# Patient Record
Sex: Female | Born: 1950 | Race: Black or African American | Hispanic: No | Smoking: Never smoker
Health system: Southern US, Community
[De-identification: ages and names within clinical notes are randomized; demographics above are authoritative.]

## PROBLEM LIST (undated history)

## (undated) DIAGNOSIS — M7581 Other shoulder lesions, right shoulder: Secondary | ICD-10-CM

## (undated) DIAGNOSIS — I8390 Asymptomatic varicose veins of unspecified lower extremity: Secondary | ICD-10-CM

## (undated) DIAGNOSIS — E039 Hypothyroidism, unspecified: Secondary | ICD-10-CM

## (undated) DIAGNOSIS — N951 Menopausal and female climacteric states: Secondary | ICD-10-CM

## (undated) DIAGNOSIS — B379 Candidiasis, unspecified: Secondary | ICD-10-CM

## (undated) DIAGNOSIS — J329 Chronic sinusitis, unspecified: Secondary | ICD-10-CM

## (undated) DIAGNOSIS — N898 Other specified noninflammatory disorders of vagina: Secondary | ICD-10-CM

## (undated) DIAGNOSIS — I10 Essential (primary) hypertension: Secondary | ICD-10-CM

## (undated) DIAGNOSIS — Z8742 Personal history of other diseases of the female genital tract: Secondary | ICD-10-CM

## (undated) DIAGNOSIS — I341 Nonrheumatic mitral (valve) prolapse: Secondary | ICD-10-CM

## (undated) DIAGNOSIS — D219 Benign neoplasm of connective and other soft tissue, unspecified: Secondary | ICD-10-CM

## (undated) DIAGNOSIS — M1990 Unspecified osteoarthritis, unspecified site: Secondary | ICD-10-CM

## (undated) HISTORY — DX: Other shoulder lesions, right shoulder: M75.81

## (undated) HISTORY — DX: Essential (primary) hypertension: I10

## (undated) HISTORY — DX: Menopausal and female climacteric states: N95.1

## (undated) HISTORY — DX: Chronic sinusitis, unspecified: J32.9

## (undated) HISTORY — DX: Asymptomatic varicose veins of unspecified lower extremity: I83.90

## (undated) HISTORY — DX: Unspecified osteoarthritis, unspecified site: M19.90

## (undated) HISTORY — DX: Personal history of other diseases of the female genital tract: Z87.42

## (undated) HISTORY — DX: Other specified noninflammatory disorders of vagina: N89.8

## (undated) HISTORY — DX: Benign neoplasm of connective and other soft tissue, unspecified: D21.9

## (undated) HISTORY — PX: ABDOMINAL HYSTERECTOMY: SHX81

## (undated) HISTORY — DX: Candidiasis, unspecified: B37.9

## (undated) HISTORY — DX: Nonrheumatic mitral (valve) prolapse: I34.1

## (undated) HISTORY — DX: Hypothyroidism, unspecified: E03.9

---

## 1994-06-13 HISTORY — PX: TOTAL ABDOMINAL HYSTERECTOMY W/ BILATERAL SALPINGOOPHORECTOMY: SHX83

## 1998-11-18 ENCOUNTER — Ambulatory Visit (HOSPITAL_COMMUNITY): Admission: RE | Admit: 1998-11-18 | Discharge: 1998-11-18 | Payer: Self-pay | Admitting: Gastroenterology

## 1999-06-14 DIAGNOSIS — M778 Other enthesopathies, not elsewhere classified: Secondary | ICD-10-CM

## 1999-06-14 HISTORY — DX: Other enthesopathies, not elsewhere classified: M77.8

## 1999-06-18 ENCOUNTER — Encounter: Admission: RE | Admit: 1999-06-18 | Discharge: 1999-06-18 | Payer: Self-pay | Admitting: Obstetrics and Gynecology

## 1999-06-18 ENCOUNTER — Encounter: Payer: Self-pay | Admitting: Obstetrics and Gynecology

## 2000-06-26 ENCOUNTER — Encounter: Admission: RE | Admit: 2000-06-26 | Discharge: 2000-06-26 | Payer: Self-pay | Admitting: Obstetrics and Gynecology

## 2000-06-26 ENCOUNTER — Encounter: Payer: Self-pay | Admitting: Obstetrics and Gynecology

## 2001-05-31 ENCOUNTER — Other Ambulatory Visit: Admission: RE | Admit: 2001-05-31 | Discharge: 2001-05-31 | Payer: Self-pay | Admitting: Obstetrics and Gynecology

## 2001-06-29 ENCOUNTER — Ambulatory Visit (HOSPITAL_COMMUNITY): Admission: RE | Admit: 2001-06-29 | Discharge: 2001-06-29 | Payer: Self-pay | Admitting: Obstetrics and Gynecology

## 2001-06-29 ENCOUNTER — Encounter: Payer: Self-pay | Admitting: Obstetrics and Gynecology

## 2002-07-02 ENCOUNTER — Other Ambulatory Visit: Admission: RE | Admit: 2002-07-02 | Discharge: 2002-07-02 | Payer: Self-pay | Admitting: Obstetrics and Gynecology

## 2002-07-16 ENCOUNTER — Encounter: Payer: Self-pay | Admitting: Obstetrics and Gynecology

## 2002-07-16 ENCOUNTER — Ambulatory Visit (HOSPITAL_COMMUNITY): Admission: RE | Admit: 2002-07-16 | Discharge: 2002-07-16 | Payer: Self-pay | Admitting: Obstetrics and Gynecology

## 2003-06-27 ENCOUNTER — Encounter: Admission: RE | Admit: 2003-06-27 | Discharge: 2003-06-27 | Payer: Self-pay | Admitting: Internal Medicine

## 2003-07-21 ENCOUNTER — Ambulatory Visit (HOSPITAL_COMMUNITY): Admission: RE | Admit: 2003-07-21 | Discharge: 2003-07-21 | Payer: Self-pay | Admitting: Obstetrics and Gynecology

## 2003-10-03 ENCOUNTER — Inpatient Hospital Stay (HOSPITAL_COMMUNITY): Admission: AD | Admit: 2003-10-03 | Discharge: 2003-10-11 | Payer: Self-pay | Admitting: Internal Medicine

## 2003-10-14 ENCOUNTER — Encounter: Admission: RE | Admit: 2003-10-14 | Discharge: 2003-10-14 | Payer: Self-pay | Admitting: Internal Medicine

## 2004-07-29 ENCOUNTER — Ambulatory Visit (HOSPITAL_COMMUNITY): Admission: RE | Admit: 2004-07-29 | Discharge: 2004-07-29 | Payer: Self-pay | Admitting: Obstetrics and Gynecology

## 2004-09-01 ENCOUNTER — Ambulatory Visit (HOSPITAL_COMMUNITY): Admission: RE | Admit: 2004-09-01 | Discharge: 2004-09-01 | Payer: Self-pay | Admitting: Gastroenterology

## 2005-08-31 ENCOUNTER — Ambulatory Visit (HOSPITAL_COMMUNITY): Admission: RE | Admit: 2005-08-31 | Discharge: 2005-08-31 | Payer: Self-pay | Admitting: Obstetrics and Gynecology

## 2006-09-05 ENCOUNTER — Ambulatory Visit (HOSPITAL_COMMUNITY): Admission: RE | Admit: 2006-09-05 | Discharge: 2006-09-05 | Payer: Self-pay | Admitting: Obstetrics and Gynecology

## 2006-09-06 ENCOUNTER — Ambulatory Visit (HOSPITAL_COMMUNITY): Admission: RE | Admit: 2006-09-06 | Discharge: 2006-09-06 | Payer: Self-pay | Admitting: Obstetrics and Gynecology

## 2007-09-25 ENCOUNTER — Ambulatory Visit (HOSPITAL_COMMUNITY): Admission: RE | Admit: 2007-09-25 | Discharge: 2007-09-25 | Payer: Self-pay | Admitting: Obstetrics and Gynecology

## 2008-11-11 ENCOUNTER — Ambulatory Visit (HOSPITAL_COMMUNITY): Admission: RE | Admit: 2008-11-11 | Discharge: 2008-11-11 | Payer: Self-pay | Admitting: Obstetrics and Gynecology

## 2009-10-26 ENCOUNTER — Encounter: Admission: RE | Admit: 2009-10-26 | Discharge: 2009-10-26 | Payer: Self-pay | Admitting: Internal Medicine

## 2009-12-16 ENCOUNTER — Ambulatory Visit (HOSPITAL_COMMUNITY): Admission: RE | Admit: 2009-12-16 | Discharge: 2009-12-16 | Payer: Self-pay | Admitting: Obstetrics and Gynecology

## 2010-10-29 NOTE — H&P (Signed)
NAME:  Tracie Sutton, Tracie Sutton                            ACCOUNT NO.:  [id number]   MEDICAL RECORD NO.:  [id number]                   PATIENT TYPE:  INP   LOCATION:  5528                                 FACILITY:  [facility]   PHYSICIAN:  Eric L. August Saucer, M.D.                  DATE OF BIRTH:  1951/05/07   DATE OF ADMISSION:  10/03/2003  DATE OF DISCHARGE:                                HISTORY & PHYSICAL   CHIEF COMPLAINT:  Progressive weakness, refractory nausea and vomiting with  diarrhea.   HISTORY OF PRESENT ILLNESS:  First recent [facility]  for this 60-  year-old, married, black female who was doing well until approximately five  days prior to admission.  The patient had recent return from a business  meeting.  She had felt well during that time.  One day later, she noted the  onset of nausea followed by vomiting.  Over the subsequent days, she had  recurring bouts of nausea and vomiting on an every one-to-two-hour basis.  This was subsequently followed by diarrhea.  Stools at that time were mostly  watery.  There was no change in odor or hematemesis, melena or hematochezia.  The patient was unable to keep mostly solid foods down.  She began drinking  ginger ale and Gator Aid.  The patient contacted our office with the above-  noted symptoms.  There was a question of bowel gastroenteritis versus  bacterial gastroenteritis.  She was given Cipro 500 mg b.i.d. for three days  empirically.  She, however, continued to have significant diarrhea.  Her  nausea was checked with Phenergan.  Today the patient felt extremely weak.  This was associated with lightheadedness when standing.  Her symptoms  improved with sitting.  When she was erect, she had more problems with  nausea as well.  The patient was seen in the office for evaluation and was  noted to be markedly hypotensive.  She was subsequently admitted for further  evaluation.  Of note, the patient had previously been on Lasix as well as  triamterene and hydrochlorothiazide for blood pressure control.  She notably  had been continuing to take this medication throughout this period of time.  She denied chest pain or shortness of breath.   PAST MEDICAL HISTORY:  1. Remarkable for longstanding hypertension.  2. She has had problems with obesity.  3. Asthma.  4. Allergic rhinitis.  5. Her history of is significant for eating a great deal of shrimp on a     recent trip.  The patient, however, denies any unusual rash or itching.  6. Past medical history is otherwise remarkable for mitral valve prolapse.  7. She is status post hysterectomy for severe uterine fibroids and     dysmenorrhea.  8. She has had significant arthritis and problems with her knees as well.   ALLERGIES:  Severe SULFA allergies.  HABITS:  The patient does not smoke or drink.   PRESENT MEDICATIONS:  1. Albuterol inhaler q.i.d.  2. Benicar 40 mg daily.  3. Triamterene and hydrochlorothiazide 25 mg daily.  4. Lasix 20 mg daily.  5. Premarin 0.3 mg p.o. daily.  6. Allegra 180 mg p.o. daily p.r.n.  7. Synthroid 100 mcg p.o. daily.  8. Cipro 500 mg b.i.d.  9. Promethazine 25 mg q.3-4h. p.r.n. nausea.   SOCIAL HISTORY:  The patient is married, has no children.  Presently works  at [employer].   PHYSICAL EXAMINATION:  GENERAL:  She is a weak-appearing black female.  Height 5 feet 4 inches, weight 192 pounds.  VITAL SIGNS:  Blood pressure lying 86/57, sitting 82/54.  Heart rate of 96,  respiratory rate 18, temperature 97.7.  HEENT:  Normocephalic, atraumatic without bruits.  Extraocular muscles  intact.  Nose:  Mild turbinate edema without occlusions.  Tympanic  membranes:  Decreased light reflex without erythematous changes.  NECK:  Supple.  No positive cervical nodes.  Throat:  Membranes are dry with  erythematous changes.  LUNGS:  Clear to auscultation and percussion.  No wheezes or rales.  No E:A  changes.  CARDIOVASCULAR:  Normal S1 and S2.  No S3 or  S4.  She has a 1/6 systolic  ejection murmur at lower left sternal border.  No rub appreciated.  ABDOMEN:  Bowel sounds present though decreased.  There is no significant  epigastric tenderness or fullness.  No dullness to percussion.  MUSCULOSKELETAL:  Full range of motion in upper extremities.  She has trace  pitting edema in the right leg versus the left.  Both legs tender to  palpation.  Negative Homan's.  NEUROLOGIC:  She was alert and oriented to person, place and time.  DTRs  were decreased.  Strength 4/5 throughout.  SKIN:  Notable for palmar erythematous changes bilaterally.  No other acute  skin changes appreciated.   LABORATORY DATA:  An EKG demonstrated normal sinus rhythm with normal axis.  She had nonspecific ST-T wave changes in the inferolateral leads which are  new from her baseline.  A stat potassium was performed revealing a value of  3.1.  She also has a creatinine preliminary of 2.9.  This is being repeated.  Abdominal film pending at this time.   IMPRESSION:  1. Symptomatic hypotension.  2. Hypokalemia secondary to nausea and vomiting, diarrhea and medication     effect.  She notably has been drinking Gator-Aid.  3. Diarrhea secondary to gastroenteritis, bacterial versus viral, rule out     other.  4. Renal insufficiency, new onset; most likely secondary to excessive     prerenal component.  Will need to follow closely.  5. History of hypertension with significantly low blood pressure at this     time.  6. Abnormal EKG.  7. Obesity.  8. History of asthma.  9. Allergic rhintis.  10.      History of hypothyroidism.   PLAN:  The patient is admitted at this time for further treatment.  Will  begin on hydration, D5 1/2 normal saline at this time.  This will be  supplemented by potassium.  She will have a run of potassium at this time as  well.  Will obtain stool specimens for O&P and enteric pathogens.  Will, thereafter, use Lomotil to control the diarrhea.   This  will be pending review of x-rays as well.  Will monitor the patient's blood  pressure closely.  Reinstitution of her  medications as tolerated.  She will  need serial EKGs with possible cardiac evaluation further if her ST-T wave  changes persist.                                                Eric L. August Saucer, M.D.    ELD/MEDQ  D:  10/03/2003  T:  10/05/2003  Job:  [id number]

## 2010-10-29 NOTE — Op Note (Signed)
NAMEAMYLA, Tracie Sutton                  ACCOUNT NO.:  [id number]   MEDICAL RECORD NO.:  [id number]          PATIENT TYPE:  AMB   LOCATION:  ENDO                         FACILITY:  [facility]   PHYSICIAN:  Anselmo Rod, M.D.  DATE OF BIRTH:  06/14/1950   DATE OF PROCEDURE:  09/01/2004  DATE OF DISCHARGE:                                 OPERATIVE REPORT   PROCEDURE PERFORMED:  Screening colonoscopy.   ENDOSCOPIST:  Anselmo Rod, M.D.   INSTRUMENT USED:  Olympus video colonoscope.   INDICATION FOR PROCEDURE:  A 60 year old female undergoing screening  colonoscopy to rule out colonic polyps, masses, etc.   PREPROCEDURE PREPARATION:  Informed consent was procured from the patient.  The patient was fasted for eight hours prior to the procedure and prepped  with a bottle of magnesium citrate and a gallon of GoLYTELY the night prior  to the procedure.  The risks and benefits of the procedure, including a 10%  miss rate of cancer and polyps, were discussed with her as well.   PREPROCEDURE PHYSICAL:  VITAL SIGNS:  The patient had stable vital signs.  NECK:  Supple.  CHEST:  Clear to auscultation.  S1, S2 regular.  ABDOMEN:  Soft with normal bowel sounds.   DESCRIPTION OF PROCEDURE:  The patient was placed in the left lateral  decubitus position and sedated with 70 mg of Demerol and 7.5 mg of Versed in  slow incremental doses.  Once the patient was adequately sedate and  maintained on low-flow oxygen and continuous cardiac monitoring, the Olympus  video colonoscope was advanced from the rectum to the cecum.  There was a  large amount of residual stool in the colon.  Multiple washes were done.  No  masses, polyps, erosions, ulcerations or diverticula were seen.  Retroflexion in the rectum revealed no abnormalities.  The patient tolerated  the procedure well without complication.   IMPRESSION:  1.  Essentially unrevealing colonoscopy up to the cecum.  2.  Large amount of residual stool in  the colon, multiple washes done.      Small lesions could be missed.   RECOMMENDATIONS:  1.  A repeat colonoscopy has been recommended in the next five years unless      the patient develops any abnormal symptoms in the interim.  2.  Outpatient follow-up as need arises in the future.      JNM/MEDQ  D:  09/01/2004  T:  09/01/2004  Job:  [id number]   cc:   Minerva Areola L. August Saucer, M.D.  [address] [id number]  [place]  [place] [address]  Fax: [phone]

## 2010-10-29 NOTE — Consult Note (Signed)
NAME:  Tracie Sutton, ROGUE                            ACCOUNT NO.:  [id number]   MEDICAL RECORD NO.:  [id number]                   PATIENT TYPE:  INP   LOCATION:  5528                                 FACILITY:  [facility]   PHYSICIAN:  Leonie Man, M.D.                DATE OF BIRTH:  December 03, 1950   DATE OF CONSULTATION:  10/06/2003  DATE OF DISCHARGE:                                   CONSULTATION   PROBLEM:  1. Persistent nausea and emesis with abdominal ultrasound showing     cholelithiasis without stigmata of acute cholecystitis. Ultrasound also     showing questionable hepatocellular disease and questionable medical     renal disease.  2. Renal insufficiency/acute renal failure with rising serum creatinine of     2.5 to 3.5 with BUN at 27, nonoliguric.   HISTORY:  This is a 60 year old female with persistent nausea and vomiting  which occurred starting one day following return from vacation in [place] [facility] where she apparently very large amounts of shrimp. She was feeling  well, but following return, she began having persistent nausea and vomiting  but all of this without any associated upper abdominal pain or pain  radiating to the back. Liver function studies have remained normal. Lipase  and amylase are pending. Her gallbladder ultrasound shows no acute changes  of cholecystitis although it shows multiple gallstones. She does have an  increasing serum creatinine and BUN which may be secondary to hypervolemia  caused by her persistent nausea and vomiting. Similarly, she has some mild  hypokalemia which may be secondary to emesis.   CURRENT MEDICATIONS:  1. Synthroid 100 mcg daily.  2. Albuterol inhaler.  3. Triamterene/hydrochlorothiazide 37.5/25 daily.  4. Premarin 0.3 mg daily.  5. Benicar 40 mg daily.  6. Allegra 180 mg taken p.r.n.   ALLERGIES:  She is allergic to SULFA-CONTAINING DRUGS.   PAST SURGICAL HISTORY:  Positive for abdominal hysterectomy.   CHRONIC MEDICAL  CONDITIONS:  1. She has a history of mitral valve prolapse which was picked up on an     echocardiogram. She is not symptomatic with any chest pain.  2. She has a history of asthma.  3. She has a history of hypothyroidism.  4. History of hypertension.  5. Post menopausal symptoms.   REVIEW OF SYSTEMS:  Negative in detail except as outlined above.   PHYSICAL EXAMINATION:  VITAL SIGNS:  Her blood pressure is 113/64 with a  pulse of 88. She is afebrile.  HEENT:  Head is normocephalic. Pupils are equal and reactive. Her sclerae is  anicteric.  LUNGS:  Clear to auscultation.  HEART:  Regular rate and rhythm. I do not hear a click listening to her  heart.  ABDOMEN:  Obese, nontender, and nondistended. She had normal active bowel  sounds. There are no palpable masses or visceromegaly.  EXTREMITIES:  Show no edema, no calf tenderness;  range of motion is within  normal limits.  NEUROLOGICAL:  Shows her to have full range of motion of her extremities. No  gross motor sensory deficits. She is fully alert and oriented.   ASSESSMENT:  1. Persistent nausea and vomiting, associated cholelithiasis, consistent     with gallbladder disease.  2. Acute renal insufficiency with rising serum creatinine, probably     secondary to hypovolemia.   PLAN/RECOMMENDATIONS:  I would start out by giving her a significant fluid  bolus over the next one to two hours. I do agree with renal consultative  evaluation. She will require cholecystectomy for her gallbladder disease.  This is consultation was requested by Dr. Willey Blade.                                               Leonie Man, M.D.    PB/MEDQ  D:  10/06/2003  T:  10/06/2003  Job:  [id number]

## 2010-10-29 NOTE — Discharge Summary (Signed)
NAME:  Tracie Sutton, Tracie Sutton                            ACCOUNT NO.:  [id number]   MEDICAL RECORD NO.:  [id number]                   PATIENT TYPE:  INP   LOCATION:  5528                                 FACILITY:  [facility]   PHYSICIAN:  Eric L. August Saucer, M.D.                  DATE OF BIRTH:  [birth year]   DATE OF ADMISSION:  10/03/2003  DATE OF DISCHARGE:  10/11/2003                                 DISCHARGE SUMMARY   FINAL DIAGNOSES:  1. Bowel enteritis, 008.8.  2. Hypovolemia, 276.5.  3. Acute renal failure, 584.9.  4. Mitral valve disorder, 424.0.  5. Protein caloric malnutrition, 263.9.  6. Hypotension, 458.9.  7. Hypokalemia, 276.8.  8. Cholelithiasis, 574.20.  9. Nausea with vomiting, 787.01.  10.      Diarrhea, 787.91.  11.      Hypertension, 401.9.  12.      Obesity, unspecified, 278.00.  13.      Allergic rhinitis, 477.9.  14.      Abnormal electrocardiogram, 794.31.  15.      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   OPERATIONS AND PROCEDURES:  None.   HISTORY OF PRESENT ILLNESS:  This was the first recent [facility]  admission for this 60 year old married black female who was doing well until  5 days prior to admission.  The patient had recently returned from a  business meeting/vacation.  She had felt well during that time.  One day  later, she noted the onset of nausea followed by vomiting.  Over the  subsequent days, the patient had recurrent bouts of nausea and vomiting on  an every 1- to 2-hour basis.  This was subsequently followed by diarrhea.  Stools at that time were mostly watery.  There was no change in odor or  hematemesis, melena or hematochezia.  The patient was unable to keep solid  foods down.  She began drinking Ginger-Ale and Gatorade.  The patient  contacted the office with the above symptoms.  There was concern for viral  gastroenteritis versus bacterial gastroenteritis.  She was empirically given  Cipro 500 mg b.i.d. for 3 days.  She, however, continued to have  significant  diarrhea.  Her nausea was checked with Phenergan.  On the day of admission,  the patient felt extremely weak.  This was associated with lightheadedness  with standing.  The patient also notably had more problems with nausea and  standing as well.  She was seen in our office for evaluation and was noted  to be markedly hypotensive.  She was subsequently admitted for further  evaluation.  Notably, upon review, the patient was continuing to take her  diuretic medication, despite her significant nausea and vomiting with  diarrhea.  The patient denied chest pain or shortness of breath.   PAST MEDICAL HISTORY AND PHYSICAL EXAM:  Past medical history and physical  exam as per admission H&P.  HOSPITAL COURSE:  The patient was admitted for further evaluation of severe  symptomatic hypotension.  She was noted to be  hypovolemic at the time of  presentation as well with a potassium of 2.9; this was felt to be secondary  to her diuretic use as well as with the GI loss.  Notably, her creatinine  also was elevated at the time presentation.  The patient was admitted to  telemetry.  She was noted at the time of admission to have a borderline  abnormal EKG.  She was started on D-5 half normal saline with potassium  replacement as well.  Cardiac enzymes were obtained q.8 h. x3, which were  negative as well.  Stools were obtained for O&P, leukocytes and enteric  pathogens.  These all returned negative.  The patient was kept at bowel rest  initially.  She was eventually given antispasmodic agents after her stool  specimens were collected.   Notably, for several days, the patient still had episode nausea with  vomiting.  Creatinine rose to a level of 4.5.  Because of ongoing nausea and  vomiting, she did undergo an abdominal ultrasound which confirmed  gallstones.  A surgical opinion was obtained with Dr. Leonie Man.  It  was felt that her present symptoms were not consistent with acute   cholelithiasis.   In view of the significant renal insufficiency, she was also seen by the  renal team.  It was felt that her presenting renal insufficiency was most  likely secondary to severe volume depletion with subsequent prerenal  component.  As noted, blood pressure remained low, even for the patient's  previously noted baseline.  Hydration was continued on patient with close  followup of her renal functions thereafter.   As noted, all stools were negative for enteric pathogens and leukocytes.  She had 1 stool which was positive for blood initially, with 3 subsequent  stools being negative.  Additional studies were performed as well including  ANA and ANCA, both of which were negative.  Eventually, with conservative  care, her GI symptoms did gradually subside.  Electrolytes were replaced as  necessary.  Notably, her anemia slowly responded as well.  Indices were  fairly suggestive of a mixed picture, i.e., with iron deficiency and chronic  disease.  Subsequent followup of this showed a gradual improvement.  The  patient was noted to be significantly malnourished at the time.  After her  GI tract gradually stabilized, she was started on full liquids with liquid  supplements as well.  The patient did tolerate this quite well.   With continued supportive measures, her creatinine did drop to 2.2 with a  BUN of 11.  Her hemoglobin stabilized at 9.7.  The patient became more  ambulatory.  She still felt weak but no significant lightheadedness.  As the  patient was strongly desirous of going home at that time, she was felt to be  stable for discharge with close followup as an outpatient.   Notably, on her evaluation by Dr. Leonie Man, it was felt that she would  still need a cholecystectomy at a later date, once she is recovering from  the acute events.  It was felt the overall picture was most consistent with a viral gastroenteritis.  The question of food allergies could not be   totally excluded, but were not consistent with presenting laboratory data.   MEDICATIONS AT THE TIME OF DISCHARGE:  Medications at the time of discharge  consisted of:  1. Synthroid  100 mcg daily.  2. Premarin 0.3 mg daily.  3. Nexium 40 mg daily.  4. Lomotil 2.5 mg after each loose stool, maximum of 5 daily.  5. Allegra 180 mg daily.  6. Multivitamins daily.  7. Phenergan 25 mg q.3-4 h. as needed for nausea.  8. Tylenol as needed for pain.   DIET:  She will be on a full-liquid diet and gradually advance to a soft  diet as tolerated.  She will be given [employer] in between  meals.   DISCHARGE INSTRUCTIONS:  She is to monitor her blood pressure twice a day.  She may restart her Benicar when her blood pressure is greater than 130/80  on 2 consecutive readings.   FOLLOWUP:  The patient is scheduled for blood work followup in 5 days' time.  She will be seen in the office in 1 week post discharge.                                                Eric L. August Saucer, M.D.    ELD/MEDQ  D:  11/13/2003  T:  11/15/2003  Job:  [id number]

## 2010-12-16 ENCOUNTER — Other Ambulatory Visit: Payer: Self-pay | Admitting: Internal Medicine

## 2010-12-16 DIAGNOSIS — Z1231 Encounter for screening mammogram for malignant neoplasm of breast: Secondary | ICD-10-CM

## 2010-12-23 ENCOUNTER — Ambulatory Visit (HOSPITAL_COMMUNITY)
Admission: RE | Admit: 2010-12-23 | Discharge: 2010-12-23 | Disposition: A | Discharge status: Home / Self Care | Payer: BC Managed Care – PPO | Source: Ambulatory Visit | Attending: Internal Medicine | Admitting: Internal Medicine

## 2010-12-23 DIAGNOSIS — Z1231 Encounter for screening mammogram for malignant neoplasm of breast: Secondary | ICD-10-CM

## 2011-09-20 ENCOUNTER — Ambulatory Visit: Payer: Self-pay | Admitting: Obstetrics and Gynecology

## 2011-10-18 ENCOUNTER — Ambulatory Visit (INDEPENDENT_AMBULATORY_CARE_PROVIDER_SITE_OTHER): Payer: BC Managed Care – PPO | Admitting: Obstetrics and Gynecology

## 2011-10-18 ENCOUNTER — Encounter: Payer: Self-pay | Admitting: Obstetrics and Gynecology

## 2011-10-18 VITALS — BP 122/62 | HR 72 | Resp 16 | Ht 64.0 in | Wt 204.0 lb

## 2011-10-18 DIAGNOSIS — E039 Hypothyroidism, unspecified: Secondary | ICD-10-CM

## 2011-10-18 DIAGNOSIS — I10 Essential (primary) hypertension: Secondary | ICD-10-CM

## 2011-10-18 NOTE — Progress Notes (Addendum)
Contraception none Last pap 2006 Last Mammo 12/23/2010 Last Colonoscopy 2010 wnl Last Dexa Scan 10/26/2009 Primary MD Willey Blade Abuse at Home none   Subjective:    Tracie Sutton is a 61 y.o. female No obstetric history on file. who presents for annual exam.  The patient has no complaints today.   The following portions of the patient's history were reviewed and updated as appropriate: allergies, current medications, past family history, past medical history, past social history, past surgical history and problem list.  Review of Systems Pertinent items are noted in HPI. Gastrointestinal:No change in bowel habits, no abdominal pain, no rectal bleeding Genitourinary:negative for dysuria, frequency, hematuria, nocturia and urinary incontinence    Objective:     BP 122/62  Pulse 72  Resp 16  Ht 5\' 4"  (1.626 m)  Wt 204 lb (92.534 kg)  BMI 35.02 kg/m2  Weight:  Wt Readings from Last 1 Encounters:  10/18/11 204 lb (92.534 kg)     BMI: Body mass index is 35.02 kg/(m^2). General Appearance: Alert, appropriate appearance for age. No acute distress HEENT: Grossly normal Neck / Thyroid: Supple, no masses, nodes or enlargement Lungs: clear to auscultation bilaterally Back: No CVA tenderness Breast Exam: No masses or nodes.No dimpling, nipple retraction or discharge. Cardiovascular: Regular rate and rhythm. S1, S2, no murmur Gastrointestinal: Soft, non-tender, no masses or organomegaly Pelvic Exam: External genitalia: normal general appearance Vaginal: normal mucosa without prolapse or lesions Cervix: removed surgically Adnexa: removed surgically Uterus: removed surgically Rectovaginal: normal rectal, no masses Lymphatic Exam: Non-palpable nodes in neck, clavicular, axillary, or inguinal regions Skin: no rash or abnormalities Neurologic: Normal gait and speech, no tremor  Psychiatric: Alert and oriented, appropriate affect.    Urinalysis:Not done      Assessment:    Normal  gyn exam HBP and thyroid disease managed by primary MD    Plan:    Discussed healthy lifestyle modifications. Mammogram. DXA  due age 45   Follow-up:  for annual exam

## 2011-12-28 ENCOUNTER — Ambulatory Visit
Admission: RE | Admit: 2011-12-28 | Discharge: 2011-12-28 | Disposition: A | Discharge status: Home / Self Care | Payer: BC Managed Care – PPO | Source: Ambulatory Visit | Attending: Internal Medicine | Admitting: Internal Medicine

## 2011-12-28 ENCOUNTER — Other Ambulatory Visit: Payer: Self-pay | Admitting: Internal Medicine

## 2011-12-28 DIAGNOSIS — R52 Pain, unspecified: Secondary | ICD-10-CM

## 2012-01-04 ENCOUNTER — Other Ambulatory Visit: Payer: Self-pay | Admitting: Obstetrics and Gynecology

## 2012-01-04 DIAGNOSIS — Z1231 Encounter for screening mammogram for malignant neoplasm of breast: Secondary | ICD-10-CM

## 2012-01-24 ENCOUNTER — Ambulatory Visit (HOSPITAL_COMMUNITY)
Admission: RE | Admit: 2012-01-24 | Discharge: 2012-01-24 | Disposition: A | Discharge status: Home / Self Care | Payer: BC Managed Care – PPO | Source: Ambulatory Visit | Attending: Obstetrics and Gynecology | Admitting: Obstetrics and Gynecology

## 2012-01-24 DIAGNOSIS — Z1231 Encounter for screening mammogram for malignant neoplasm of breast: Secondary | ICD-10-CM

## 2012-05-08 HISTORY — PX: FOOT SURGERY: SHX648

## 2012-06-01 LAB — BASIC METABOLIC PANEL
BUN: 16 mg/dL (ref 4–21)
Creatinine: 0.8 mg/dL (ref 0.5–1.1)
Glucose: 109 mg/dL
Potassium: 3.7 mmol/L (ref 3.4–5.3)
Sodium: 140 mmol/L (ref 137–147)

## 2012-06-01 LAB — HEMOGLOBIN A1C: Hgb A1c MFr Bld: 5.6 % (ref 4.0–6.0)

## 2012-06-01 LAB — HEPATIC FUNCTION PANEL: Bilirubin, Total: 0.7 mg/dL

## 2012-08-10 ENCOUNTER — Encounter: Payer: Self-pay | Admitting: General Practice

## 2012-08-10 DIAGNOSIS — J309 Allergic rhinitis, unspecified: Secondary | ICD-10-CM

## 2012-12-31 ENCOUNTER — Other Ambulatory Visit: Payer: Self-pay | Admitting: Obstetrics and Gynecology

## 2012-12-31 DIAGNOSIS — Z1231 Encounter for screening mammogram for malignant neoplasm of breast: Secondary | ICD-10-CM

## 2013-01-25 ENCOUNTER — Ambulatory Visit (HOSPITAL_COMMUNITY)
Admission: RE | Admit: 2013-01-25 | Discharge: 2013-01-25 | Disposition: A | Discharge status: Home / Self Care | Payer: BC Managed Care – PPO | Source: Ambulatory Visit | Attending: Obstetrics and Gynecology | Admitting: Obstetrics and Gynecology

## 2013-01-25 DIAGNOSIS — Z1231 Encounter for screening mammogram for malignant neoplasm of breast: Secondary | ICD-10-CM | POA: Insufficient documentation

## 2014-01-22 ENCOUNTER — Other Ambulatory Visit: Payer: Self-pay | Admitting: Obstetrics and Gynecology

## 2014-01-22 DIAGNOSIS — Z1231 Encounter for screening mammogram for malignant neoplasm of breast: Secondary | ICD-10-CM

## 2014-01-27 ENCOUNTER — Ambulatory Visit (HOSPITAL_COMMUNITY)
Admission: RE | Admit: 2014-01-27 | Discharge: 2014-01-27 | Disposition: A | Discharge status: Home / Self Care | Payer: BC Managed Care – PPO | Source: Ambulatory Visit | Attending: Obstetrics and Gynecology | Admitting: Obstetrics and Gynecology

## 2014-01-27 DIAGNOSIS — Z1231 Encounter for screening mammogram for malignant neoplasm of breast: Secondary | ICD-10-CM | POA: Insufficient documentation

## 2020-07-06 ENCOUNTER — Ambulatory Visit
Admission: RE | Admit: 2020-07-06 | Discharge: 2020-07-06 | Disposition: A | Discharge status: Home / Self Care | Payer: Medicare PPO | Source: Ambulatory Visit | Attending: Internal Medicine | Admitting: Internal Medicine

## 2020-07-06 ENCOUNTER — Other Ambulatory Visit: Payer: Self-pay

## 2020-07-06 ENCOUNTER — Other Ambulatory Visit: Payer: Self-pay | Admitting: Internal Medicine

## 2020-07-06 DIAGNOSIS — M542 Cervicalgia: Secondary | ICD-10-CM

## 2020-07-06 DIAGNOSIS — M25519 Pain in unspecified shoulder: Secondary | ICD-10-CM

## 2020-07-13 ENCOUNTER — Other Ambulatory Visit: Payer: Self-pay | Admitting: Internal Medicine

## 2020-07-13 DIAGNOSIS — M542 Cervicalgia: Secondary | ICD-10-CM

## 2020-07-20 ENCOUNTER — Other Ambulatory Visit: Payer: Self-pay

## 2020-07-20 ENCOUNTER — Ambulatory Visit
Admission: RE | Admit: 2020-07-20 | Discharge: 2020-07-20 | Disposition: A | Discharge status: Home / Self Care | Payer: Medicare PPO | Source: Ambulatory Visit | Attending: Internal Medicine | Admitting: Internal Medicine

## 2020-07-20 DIAGNOSIS — M542 Cervicalgia: Secondary | ICD-10-CM

## 2020-11-10 DIAGNOSIS — M1712 Unilateral primary osteoarthritis, left knee: Secondary | ICD-10-CM | POA: Diagnosis present

## 2020-11-11 NOTE — Patient Instructions (Addendum)
DUE TO COVID-19 ONLY ONE VISITOR IS ALLOWED TO COME WITH YOU AND STAY IN THE WAITING ROOM ONLY DURING PRE OP AND PROCEDURE DAY OF SURGERY. THE 2 VISITORS  MAY VISIT WITH YOU AFTER SURGERY IN YOUR PRIVATE ROOM DURING VISITING HOURS ONLY!  YOU NEED TO HAVE A COVID 19 TEST ON   6/9___ @__10 :00_____, THIS TEST MUST BE DONE BEFORE SURGERY,  COVID TESTING SITE [place] [id number], IT IS ON THE RIGHT GOING OUT [address] APPROXIMATELY  2 MINUTES PAST [employer] ON THE RIGHT. ONCE YOUR COVID TEST IS COMPLETED,  PLEASE BEGIN THE QUARANTINE INSTRUCTIONS AS OUTLINED IN YOUR HANDOUT.                Nitasha D Holsinger    Your procedure is scheduled on: 11/24/20   Report to [facility] Main  Entrance   Report to Short Stay at 5:15 AM     Call this number if you have problems the morning of surgery [place], NO CHEWING GUM [place].  No food after midnight.    You may have clear liquid until 4:30 AM.    At 4:00 AM drink pre surgery drink.   Nothing by mouth after 4:30 AM.    Take these medicines the morning of surgery with A SIP OF WATER: Diltiazem, Levothyroxine, use your inhaler and bring it with you                                 You may not have any metal on your body including hair pins and              piercings  Do not wear jewelry, make-up, lotions, powders or perfumes, deodorant             Do not wear nail polish on your fingernails.  Do not shave  48 hours prior to surgery.                Do not bring valuables to the hospital. [place].  Contacts, dentures or bridgework may not be worn into surgery.      Patients discharged the day of surgery will not be allowed to drive home.  IF YOU ARE HAVING SURGERY AND GOING HOME THE SAME DAY, YOU MUST HAVE AN ADULT TO DRIVE YOU HOME AND BE WITH YOU FOR 24 HOURS. YOU MAY GO  HOME BY TAXI OR UBER OR ORTHERWISE, BUT AN ADULT MUST ACCOMPANY YOU HOME AND STAY WITH YOU FOR 24 HOURS.  Name and phone number of your driver:  Special Instructions: N/A              Please read over the following fact sheets you were given: _____________________________________________________________________             [facility] - Preparing for Surgery Before surgery, you can play an important role.  Because skin is not sterile, your skin needs to be as free of germs as possible.  You can reduce the number of germs on your skin by washing with CHG (chlorahexidine gluconate) soap before surgery.  CHG is an antiseptic cleaner which kills germs and bonds with the skin to continue killing germs even after washing.  Please DO NOT use if you have an allergy to CHG or antibacterial soaps.  If your skin becomes reddened/irritated stop using the CHG and inform your nurse when you arrive at Short Stay. Do not shave (including legs and underarms) for at least 48 hours prior to the first CHG shower.    Please follow these instructions carefully:  1.  Shower with CHG Soap the night before surgery and the  morning of Surgery.  2.  If you choose to wash your hair, wash your hair first as usual with your  normal  shampoo.  3.  After you shampoo, rinse your hair and body thoroughly to remove the  shampoo.                                        4.  Use CHG as you would any other liquid soap.  You can apply chg directly  to the skin and wash                       Gently with a scrungie or clean washcloth.  5.  Apply the CHG Soap to your body ONLY FROM THE NECK DOWN.   Do not use on face/ open                           Wound or open sores. Avoid contact with eyes, ears mouth and genitals (private parts).                       Wash face,  Genitals (private parts) with your normal soap.             6.  Wash thoroughly, paying special attention to the area where your surgery  will be performed.  7.   Thoroughly rinse your body with warm water from the neck down.  8.  DO NOT shower/wash with your normal soap after using and rinsing off  the CHG Soap.             9.  Pat yourself dry with a clean towel.            10.  Wear clean pajamas.            11.  Place clean sheets on your bed the night of your first shower and do not  sleep with pets. Day of Surgery : Do not apply any lotions/deodorants the morning of surgery.  Please wear clean clothes to the hospital/surgery center.  FAILURE TO FOLLOW THESE INSTRUCTIONS MAY RESULT IN THE CANCELLATION OF YOUR SURGERY PATIENT SIGNATURE_________________________________  NURSE SIGNATURE__________________________________  ________________________________________________________________________   Adam Phenix  An incentive spirometer is a tool that can help keep your lungs clear and active. This tool measures how well you are filling your lungs with each breath. Taking long deep breaths may help reverse or decrease the chance of developing breathing (pulmonary) problems (especially infection) following:  A long period of time when you are unable to move or be active. BEFORE THE PROCEDURE   If the spirometer includes an indicator to show your best effort, your nurse or respiratory therapist will set it to a desired goal.  If possible, sit up straight or lean slightly forward. Try not to slouch.  Hold the incentive spirometer in an upright position. INSTRUCTIONS FOR USE  1.  Sit on the edge of your bed if possible, or sit up as far as you can in bed or on a chair. 2. Hold the incentive spirometer in an upright position. 3. Breathe out normally. 4. Place the mouthpiece in your mouth and seal your lips tightly around it. 5. Breathe in slowly and as deeply as possible, raising the piston or the ball toward the top of the column. 6. Hold your breath for 3-5 seconds or for as long as possible. Allow the piston or ball to fall to the bottom of  the column. 7. Remove the mouthpiece from your mouth and breathe out normally. 8. Rest for a few seconds and repeat Steps 1 through 7 at least 10 times every 1-2 hours when you are awake. Take your time and take a few normal breaths between deep breaths. 9. The spirometer may include an indicator to show your best effort. Use the indicator as a goal to work toward during each repetition. 10. After each set of 10 deep breaths, practice coughing to be sure your lungs are clear. If you have an incision (the cut made at the time of surgery), support your incision when coughing by placing a pillow or rolled up towels firmly against it. Once you are able to get out of bed, walk around indoors and cough well. You may stop using the incentive spirometer when instructed by your caregiver.  RISKS AND COMPLICATIONS  Take your time so you do not get dizzy or light-headed.  If you are in pain, you may need to take or ask for pain medication before doing incentive spirometry. It is harder to take a deep breath if you are having pain. AFTER USE  Rest and breathe slowly and easily.  It can be helpful to keep track of a log of your progress. Your caregiver can provide you with a simple table to help with this. If you are using the spirometer at home, follow these instructions: [place] IF:   You are having difficultly using the spirometer.  You have trouble using the spirometer as often as instructed.  Your pain medication is not giving enough relief while using the spirometer.  You develop fever of 100.5 F (38.1 C) or higher. SEEK IMMEDIATE MEDICAL CARE IF:   You cough up bloody sputum that had not been present before.  You develop fever of 102 F (38.9 C) or greater.  You develop worsening pain at or near the incision site. MAKE SURE YOU:   Understand these instructions.  Will watch your condition.  Will get help right away if you are not doing well or get worse. Document  Released: 10/10/2006 Document Revised: 08/22/2011 Document Reviewed: 12/11/2006 [facility] Patient Information 2014 [place], [place].   ________________________________________________________________________

## 2020-11-12 ENCOUNTER — Encounter (HOSPITAL_COMMUNITY)
Admission: RE | Admit: 2020-11-12 | Discharge: 2020-11-12 | Disposition: A | Discharge status: Home / Self Care | Payer: Medicare PPO | Source: Ambulatory Visit | Attending: Orthopedic Surgery | Admitting: Orthopedic Surgery

## 2020-11-12 ENCOUNTER — Encounter (HOSPITAL_COMMUNITY): Payer: Self-pay

## 2020-11-12 ENCOUNTER — Encounter (INDEPENDENT_AMBULATORY_CARE_PROVIDER_SITE_OTHER): Payer: Self-pay

## 2020-11-12 ENCOUNTER — Other Ambulatory Visit: Payer: Self-pay

## 2020-11-12 DIAGNOSIS — Z01812 Encounter for preprocedural laboratory examination: Secondary | ICD-10-CM | POA: Diagnosis present

## 2020-11-12 LAB — CBC
HCT: 37.8 % (ref 36.0–46.0)
Hemoglobin: 11.8 g/dL — ABNORMAL LOW (ref 12.0–15.0)
MCH: 30.2 pg (ref 26.0–34.0)
MCHC: 31.2 g/dL (ref 30.0–36.0)
MCV: 96.7 fL (ref 80.0–100.0)
Platelets: 307 10*3/uL (ref 150–400)
RBC: 3.91 MIL/uL (ref 3.87–5.11)
RDW: 14 % (ref 11.5–15.5)
WBC: 8.4 10*3/uL (ref 4.0–10.5)
nRBC: 0 % (ref 0.0–0.2)

## 2020-11-12 LAB — BASIC METABOLIC PANEL
Anion gap: 9 (ref 5–15)
BUN: 33 mg/dL — ABNORMAL HIGH (ref 8–23)
CO2: 28 mmol/L (ref 22–32)
Calcium: 9.7 mg/dL (ref 8.9–10.3)
Chloride: 105 mmol/L (ref 98–111)
Creatinine, Ser: 1.24 mg/dL — ABNORMAL HIGH (ref 0.44–1.00)
GFR, Estimated: 47 mL/min — ABNORMAL LOW (ref >60)
Glucose, Bld: 107 mg/dL — ABNORMAL HIGH (ref 70–99)
Potassium: 3.3 mmol/L — ABNORMAL LOW (ref 3.5–5.1)
Sodium: 142 mmol/L (ref 135–145)

## 2020-11-12 LAB — SURGICAL PCR SCREEN
MRSA, PCR: NEGATIVE
Staphylococcus aureus: NEGATIVE

## 2020-11-12 NOTE — Progress Notes (Signed)
COVID Vaccine Completed:Yes Date COVID Vaccine completed: 08/24/19-booster 04/18/20, 09/17/20 COVID vaccine manufacturer:    Moderna     PCP - Dr. Mont Dutton Cardiologist - none  Chest x-ray - no EKG - January at her PCP's. called and requested EKG Stress Test - no ECHO - no Cardiac Cath - no Pacemaker/ICD device last checked:NA  Sleep Study - no CPAP -   Fasting Blood Sugar - NA Checks Blood Sugar _____ times a day  Blood Thinner Instructions:NA Aspirin Instructions: Last Dose:  Anesthesia review:   Patient denies shortness of breath, fever, cough and chest pain at PAT appointment Pt has Asthma and uses an inhaler every day. She reports no SOB with any activities  Patient verbalized understanding of instructions that were given to them at the PAT appointment. Patient was also instructed that they will need to review over the PAT instructions again at home before surgery.Yes

## 2020-11-13 ENCOUNTER — Encounter (HOSPITAL_COMMUNITY): Payer: Self-pay

## 2020-11-19 ENCOUNTER — Other Ambulatory Visit (HOSPITAL_COMMUNITY)
Admission: RE | Admit: 2020-11-19 | Discharge: 2020-11-19 | Disposition: A | Discharge status: Home / Self Care | Payer: Medicare PPO | Source: Ambulatory Visit | Attending: Orthopedic Surgery | Admitting: Orthopedic Surgery

## 2020-11-19 DIAGNOSIS — Z01812 Encounter for preprocedural laboratory examination: Secondary | ICD-10-CM | POA: Insufficient documentation

## 2020-11-19 DIAGNOSIS — Z20822 Contact with and (suspected) exposure to covid-19: Secondary | ICD-10-CM | POA: Diagnosis not present

## 2020-11-19 LAB — SARS CORONAVIRUS 2 (TAT 6-24 HRS): SARS Coronavirus 2: NEGATIVE

## 2020-11-23 ENCOUNTER — Encounter (HOSPITAL_COMMUNITY): Payer: Self-pay | Admitting: Orthopedic Surgery

## 2020-11-23 NOTE — H&P (Signed)
KNEE ARTHROPLASTY ADMISSION H&P  Patient ID: Tracie Sutton MRN: [id number] DOB/AGE: 1951-03-10 70 y.o.  Chief Complaint: left knee pain.  Planned Procedure Date: 11/24/20 Medical Clearance by Dr. Marlou Sa     HPI: Tracie Sutton is a 70 y.o. female who presents for evaluation of djd left knee. The patient has a history of pain and functional disability in the left knee due to arthritis and has failed non-surgical conservative treatments for greater than 12 weeks to include NSAID's and/or analgesics, corticosteriod injections, and activity modification.  Onset of symptoms was gradual, starting 10 years ago with gradually worsening course since that time. The patient noted no past surgery on the left knee.  Patient currently rates pain at 3 out of 10 with activity. Patient has worsening of pain with activity and weight bearing and pain that interferes with activities of daily living.  Patient has evidence of joint space narrowing by imaging studies.  There is no active infection.  Past Medical History:  Diagnosis Date   Arthritis    Asthma    inhaler daily   Fibroid    H/O dysmenorrhea    H/O menorrhagia    Hypertension    Hypothyroidism    Menopausal symptoms    Mitral valve prolapse    Sinusitis    Tendonitis of shoulder, right 2001   Vaginal dryness    Varicose veins    Yeast infection    Past Surgical History:  Procedure Laterality Date   ABDOMINAL HYSTERECTOMY     EYE SURGERY Bilateral 05/1991   FOOT SURGERY  05/08/12   TOTAL ABDOMINAL HYSTERECTOMY W/ BILATERAL SALPINGOOPHORECTOMY  1996   Allergies  Allergen Reactions   Sulfa Antibiotics Hives   Prior to Admission medications   Medication Sig Start Date End Date Taking? Authorizing Provider  albuterol (VENTOLIN HFA) 108 (90 Base) MCG/ACT inhaler Inhale 2 puffs into the lungs every 6 (six) hours as needed for wheezing or shortness of breath.   Yes [provider]  allopurinol (ZYLOPRIM) 100 MG tablet Take 100 mg by  mouth daily.   Yes [provider]  baclofen (LIORESAL) 10 MG tablet Take 10 mg by mouth 2 (two) times daily as needed for muscle spasms. 10/16/20  Yes [provider]  cetirizine (ZYRTEC) 10 MG tablet Take 10 mg by mouth daily.   Yes [provider]  COLLAGEN PO Take 2 Scoops by mouth daily.   Yes [provider]  diltiazem (DILACOR XR) 240 MG 24 hr capsule Take 240 mg by mouth daily.   Yes [provider]  latanoprost (XALATAN) 0.005 % ophthalmic solution Place 1 drop into both eyes at bedtime.   Yes [provider]  levothyroxine (SYNTHROID, LEVOTHROID) 100 MCG tablet Take 100 mcg by mouth daily before breakfast.   Yes [provider]  losartan (COZAAR) 100 MG tablet Take 100 mg by mouth daily.   Yes [provider]  Misc Natural Products (OSTEO BI-FLEX ADV JOINT SHIELD PO) Take 1 tablet by mouth daily.   Yes [provider]  montelukast (SINGULAIR) 10 MG tablet Take 10 mg by mouth at bedtime.   Yes [provider]  Multiple Vitamin (MULTIVITAMIN) tablet Take 1 tablet by mouth daily.   Yes [provider]  naproxen sodium (ALEVE) 220 MG tablet Take 440 mg by mouth 2 (two) times daily as needed.   Yes [provider]  OVER THE COUNTER MEDICATION Apply 2 sprays topically daily. CBD Spray   Yes [provider]  rosuvastatin (CRESTOR) 5 MG tablet Take 5 mg by mouth at bedtime.   Yes [provider]  triamterene-hydrochlorothiazide (MAXZIDE-25) 37.5-25 MG per tablet Take 1 tablet by mouth daily.   Yes [provider]  TURMERIC PO Take 1,500 mg by mouth daily.   Yes [provider]   Social History   Socioeconomic History   Marital status: Divorced    Spouse name: Not on file   Number of children: Not on file   Years of education: Not on file   Highest education level: Not on file  Occupational History   Not on file  Tobacco Use   Smoking status: Never    Smokeless tobacco: Never  Vaping Use   Vaping Use: Never used  Substance and Sexual Activity   Alcohol use: No   Drug use: No   Sexual activity: Never    Birth control/protection: None  Other Topics Concern   Not on file  Social History Narrative   Not on file   Social Determinants of Health   Financial Resource Strain: Not on file  Food Insecurity: Not on file  Transportation Needs: Not on file  Physical Activity: Not on file  Stress: Not on file  Social Connections: Not on file   Family History  Problem Relation Age of Onset   Cancer Mother    Cancer Maternal Aunt     ROS: Currently denies lightheadedness, dizziness, Fever, chills, CP, SOB.   No personal history of DVT, PE, MI, or CVA. No loose teeth or dentures All other systems have been reviewed and were otherwise currently negative with the exception of those mentioned in the HPI and as above.  Objective: Vitals: Ht: 61 inches Wt: 202 lbs Temp: 98 F BP: 157/73 Pulse: 88 O2 96% on room air.   Physical Exam: General: Alert, NAD.  Antalgic Gait  HEENT: EOMI, Good Neck Extension  Pulm: No increased work of breathing.  Clear B/L A/P w/o crackle or wheeze.  CV: RRR, No m/g/r appreciated  GI: soft, NT, ND Neuro: Neuro without gross focal deficit.  Sensation intact distally Skin: No lesions in the area of chief complaint MSK/Surgical Site: left knee w/o redness or effusion.  5-95 deg AROM.  5/5 strength in extension and flexion.  +EHL/FHL.  NVI.  Stable varus and valgus stress.    Imaging Review Plain radiographs demonstrate severe degenerative joint disease of the left knee.   The overall alignment issignificant varus. The bone quality appears to be fair for age and reported activity level.  Preoperative templating of the joint replacement has been completed, documented, and submitted to the Operating Room personnel in order to optimize intra-operative equipment management.  Assessment: djd left knee Principal  Problem:   Osteoarthritis of left knee   Plan: Plan for Procedure(s): TOTAL KNEE ARTHROPLASTY  The patient history, physical exam, clinical judgement of the provider and imaging are consistent with end stage degenerative joint disease and total joint arthroplasty is deemed medically necessary. The treatment options including medical management, injection therapy, and arthroplasty were discussed at length. The risks and benefits of Procedure(s): TOTAL KNEE ARTHROPLASTY were presented and reviewed.  The risks of nonoperative treatment, versus surgical intervention including but not limited to continued pain, aseptic loosening, stiffness, dislocation/subluxation, infection, bleeding, nerve injury, blood clots, cardiopulmonary complications, morbidity, mortality, among others were discussed. The patient verbalizes understanding and wishes to proceed with the plan.  Patient is being admitted for inpatient treatment for surgery, pain control, PT, prophylactic antibiotics,  VTE prophylaxis, progressive ambulation, ADL's and discharge planning.   Dental prophylaxis discussed and recommended for 2 years postoperatively.  The patient does meet the criteria for TXA which will be used perioperatively.   ASA 325 mg will be used postoperatively for DVT prophylaxis in addition to SCDs, and early ambulation. The patient is planning to be discharged home with HHPT in care of her cousin   Anticipated LOS equal to or greater than 2 midnights due to - Age 62 and older with one or more of the following:  - Obesity  - Expected need for hospital services (PT, OT, Nursing) required for safe  discharge  - Anticipated need for postoperative skilled nursing care or inpatient rehab  - Active co-morbidities: None OR   - Unanticipated findings during/Post Surgery: None  - Patient is a high risk of re-admission due to: None    Ventura Bruns, PA-C 11/23/2020 2:58 PM

## 2020-11-23 NOTE — Anesthesia Preprocedure Evaluation (Addendum)
Anesthesia Evaluation  Patient identified by MRN, date of birth, ID band Patient awake    Reviewed: Allergy & Precautions, NPO status , Patient's Chart, lab work & pertinent test results, reviewed documented beta blocker date and time   Airway Mallampati: II  TM Distance: >3 FB Neck ROM: Full    Dental no notable dental hx. (+) Teeth Intact, Dental Advisory Given   Pulmonary asthma ,    Pulmonary exam normal breath sounds clear to auscultation       Cardiovascular hypertension, Pt. on medications Normal cardiovascular exam+ Valvular Problems/Murmurs MVP  Rhythm:Regular Rate:Normal     Neuro/Psych Glaucoma negative psych ROS   GI/Hepatic negative GI ROS, Neg liver ROS,   Endo/Other  Hypothyroidism Morbid obesityHyperlipidemia  Renal/GU Renal InsufficiencyRenal disease  negative genitourinary   Musculoskeletal  (+) Arthritis , Osteoarthritis,  OA left knee   Abdominal (+) + obese,   Peds  Hematology   Anesthesia Other Findings   Reproductive/Obstetrics                            Anesthesia Physical Anesthesia Plan  ASA: 3  Anesthesia Plan: Spinal   Post-op Pain Management:  Regional for Post-op pain   Induction: Intravenous  PONV Risk Score and Plan: 3 and Treatment may vary due to age or medical condition  Airway Management Planned: Natural Airway and Simple Face Mask  Additional Equipment:   Intra-op Plan:   Post-operative Plan:   Informed Consent: I have reviewed the patients History and Physical, chart, labs and discussed the procedure including the risks, benefits and alternatives for the proposed anesthesia with the patient or authorized representative who has indicated his/her understanding and acceptance.     Dental advisory given  Plan Discussed with: CRNA and Anesthesiologist  Anesthesia Plan Comments:        Anesthesia Quick Evaluation

## 2020-11-24 ENCOUNTER — Encounter (HOSPITAL_COMMUNITY)
Admission: RE | Disposition: A | Discharge status: Home / Self Care | Payer: Self-pay | Source: Home / Self Care | Attending: Orthopedic Surgery

## 2020-11-24 ENCOUNTER — Ambulatory Visit (HOSPITAL_COMMUNITY): Payer: Medicare PPO

## 2020-11-24 ENCOUNTER — Ambulatory Visit (HOSPITAL_COMMUNITY): Payer: Medicare PPO | Admitting: Anesthesiology

## 2020-11-24 ENCOUNTER — Ambulatory Visit (HOSPITAL_COMMUNITY)
Admission: RE | Admit: 2020-11-24 | Discharge: 2020-11-24 | Disposition: A | Discharge status: Home / Self Care | Payer: Medicare PPO | Attending: Orthopedic Surgery | Admitting: Orthopedic Surgery

## 2020-11-24 ENCOUNTER — Ambulatory Visit (HOSPITAL_COMMUNITY): Payer: Medicare PPO | Admitting: Physician Assistant

## 2020-11-24 ENCOUNTER — Encounter (HOSPITAL_COMMUNITY): Payer: Self-pay | Admitting: Orthopedic Surgery

## 2020-11-24 DIAGNOSIS — Z79890 Hormone replacement therapy (postmenopausal): Secondary | ICD-10-CM | POA: Diagnosis not present

## 2020-11-24 DIAGNOSIS — Z96652 Presence of left artificial knee joint: Secondary | ICD-10-CM

## 2020-11-24 DIAGNOSIS — M1712 Unilateral primary osteoarthritis, left knee: Secondary | ICD-10-CM | POA: Diagnosis present

## 2020-11-24 DIAGNOSIS — I341 Nonrheumatic mitral (valve) prolapse: Secondary | ICD-10-CM | POA: Insufficient documentation

## 2020-11-24 DIAGNOSIS — Z79899 Other long term (current) drug therapy: Secondary | ICD-10-CM | POA: Diagnosis not present

## 2020-11-24 DIAGNOSIS — Z882 Allergy status to sulfonamides status: Secondary | ICD-10-CM | POA: Insufficient documentation

## 2020-11-24 DIAGNOSIS — E039 Hypothyroidism, unspecified: Secondary | ICD-10-CM | POA: Diagnosis not present

## 2020-11-24 HISTORY — PX: TOTAL KNEE ARTHROPLASTY: SHX125

## 2020-11-24 SURGERY — ARTHROPLASTY, KNEE, TOTAL
Anesthesia: Spinal | Site: Knee | Laterality: Left

## 2020-11-24 MED ORDER — ROPIVACAINE HCL 7.5 MG/ML IJ SOLN
INTRAMUSCULAR | Status: DC | PRN
  Administered 2020-11-24: 20 mL via PERINEURAL

## 2020-11-24 MED ORDER — POVIDONE-IODINE 10 % EX SWAB: 2.0000 "application " | Freq: Once | CUTANEOUS | Status: DC

## 2020-11-24 MED ORDER — SENNA-DOCUSATE SODIUM 8.6-50 MG PO TABS: 2.0000 | ORAL_TABLET | Freq: Every day | ORAL | 1 refills | Status: AC

## 2020-11-24 MED ORDER — CEFAZOLIN SODIUM-DEXTROSE 2-4 GM/100ML-% IV SOLN
INTRAVENOUS | Status: AC
  Filled 2020-11-24: qty 100

## 2020-11-24 MED ORDER — MIDAZOLAM HCL 2 MG/2ML IJ SOLN
INTRAMUSCULAR | Status: AC
  Filled 2020-11-24: qty 2

## 2020-11-24 MED ORDER — TRANEXAMIC ACID-NACL 1000-0.7 MG/100ML-% IV SOLN
INTRAVENOUS | Status: AC
  Filled 2020-11-24: qty 100

## 2020-11-24 MED ORDER — PROPOFOL 1000 MG/100ML IV EMUL
INTRAVENOUS | Status: AC
  Filled 2020-11-24: qty 100

## 2020-11-24 MED ORDER — BUPIVACAINE IN DEXTROSE 0.75-8.25 % IT SOLN
INTRATHECAL | Status: DC | PRN
  Administered 2020-11-24: 1.7 mL via INTRATHECAL

## 2020-11-24 MED ORDER — ACETAMINOPHEN 500 MG PO TABS
1000.0000 mg | ORAL_TABLET | Freq: Once | ORAL | Status: AC
  Administered 2020-11-24: 1000 mg via ORAL
  Filled 2020-11-24: qty 2

## 2020-11-24 MED ORDER — LACTATED RINGERS IV BOLUS
500.0000 mL | Freq: Once | INTRAVENOUS | Status: AC
  Administered 2020-11-24: 500 mL via INTRAVENOUS

## 2020-11-24 MED ORDER — ORAL CARE MOUTH RINSE: 15.0000 mL | Freq: Once | OROMUCOSAL | Status: AC

## 2020-11-24 MED ORDER — BUPIVACAINE HCL 0.25 % IJ SOLN
INTRAMUSCULAR | Status: DC | PRN
  Administered 2020-11-24: 30 mL

## 2020-11-24 MED ORDER — FENTANYL CITRATE (PF) 100 MCG/2ML IJ SOLN
INTRAMUSCULAR | Status: DC | PRN
  Administered 2020-11-24 (×2): 25 ug via INTRAVENOUS
  Administered 2020-11-24: 50 ug via INTRAVENOUS

## 2020-11-24 MED ORDER — DEXAMETHASONE SODIUM PHOSPHATE 10 MG/ML IJ SOLN
INTRAMUSCULAR | Status: AC
  Filled 2020-11-24: qty 1

## 2020-11-24 MED ORDER — PROPOFOL 500 MG/50ML IV EMUL
INTRAVENOUS | Status: DC | PRN
  Administered 2020-11-24: 75 ug/kg/min via INTRAVENOUS

## 2020-11-24 MED ORDER — ONDANSETRON HCL 4 MG/2ML IJ SOLN
INTRAMUSCULAR | Status: AC
  Filled 2020-11-24: qty 2

## 2020-11-24 MED ORDER — FENTANYL CITRATE (PF) 100 MCG/2ML IJ SOLN: 25.0000 ug | INTRAMUSCULAR | Status: DC | PRN

## 2020-11-24 MED ORDER — LACTATED RINGERS IV SOLN: INTRAVENOUS | Status: DC

## 2020-11-24 MED ORDER — OXYCODONE HCL 5 MG PO TABS: 5.0000 mg | ORAL_TABLET | ORAL | 0 refills | Status: AC | PRN

## 2020-11-24 MED ORDER — ASPIRIN EC 325 MG PO TBEC: 325.0000 mg | DELAYED_RELEASE_TABLET | Freq: Two times a day (BID) | ORAL | 0 refills | Status: AC

## 2020-11-24 MED ORDER — BUPIVACAINE HCL (PF) 0.25 % IJ SOLN
INTRAMUSCULAR | Status: AC
  Filled 2020-11-24: qty 30

## 2020-11-24 MED ORDER — ONDANSETRON HCL 4 MG/2ML IJ SOLN
INTRAMUSCULAR | Status: DC | PRN
  Administered 2020-11-24: 4 mg via INTRAVENOUS

## 2020-11-24 MED ORDER — CEFAZOLIN SODIUM-DEXTROSE 2-4 GM/100ML-% IV SOLN
2.0000 g | INTRAVENOUS | Status: AC
  Administered 2020-11-24: 2 g via INTRAVENOUS
  Filled 2020-11-24: qty 100

## 2020-11-24 MED ORDER — WATER FOR IRRIGATION, STERILE IR SOLN
Status: DC | PRN
  Administered 2020-11-24: 2000 mL

## 2020-11-24 MED ORDER — KETOROLAC TROMETHAMINE 30 MG/ML IJ SOLN
INTRAMUSCULAR | Status: DC | PRN
  Administered 2020-11-24: 30 mg via INTRA_ARTICULAR

## 2020-11-24 MED ORDER — TRANEXAMIC ACID-NACL 1000-0.7 MG/100ML-% IV SOLN
1000.0000 mg | INTRAVENOUS | Status: AC
  Administered 2020-11-24: 1000 mg via INTRAVENOUS
  Filled 2020-11-24: qty 100

## 2020-11-24 MED ORDER — PROPOFOL 10 MG/ML IV BOLUS
INTRAVENOUS | Status: DC | PRN
  Administered 2020-11-24 (×2): 20 mg via INTRAVENOUS

## 2020-11-24 MED ORDER — SODIUM CHLORIDE 0.9 % IR SOLN
Status: DC | PRN
  Administered 2020-11-24: 3000 mL
  Administered 2020-11-24: 1000 mL

## 2020-11-24 MED ORDER — MIDAZOLAM HCL 5 MG/5ML IJ SOLN
INTRAMUSCULAR | Status: DC | PRN
  Administered 2020-11-24 (×2): 1 mg via INTRAVENOUS

## 2020-11-24 MED ORDER — PROPOFOL 10 MG/ML IV BOLUS
INTRAVENOUS | Status: AC
  Filled 2020-11-24: qty 20

## 2020-11-24 MED ORDER — ONDANSETRON HCL 4 MG/2ML IJ SOLN: 4.0000 mg | Freq: Once | INTRAMUSCULAR | Status: DC | PRN

## 2020-11-24 MED ORDER — KETOROLAC TROMETHAMINE 30 MG/ML IJ SOLN
INTRAMUSCULAR | Status: AC
  Filled 2020-11-24: qty 1

## 2020-11-24 MED ORDER — POVIDONE-IODINE 10 % EX SWAB
2.0000 "application " | Freq: Once | CUTANEOUS | Status: AC
  Administered 2020-11-24: 2 via TOPICAL

## 2020-11-24 MED ORDER — FENTANYL CITRATE (PF) 100 MCG/2ML IJ SOLN
INTRAMUSCULAR | Status: AC
  Filled 2020-11-24: qty 2

## 2020-11-24 MED ORDER — ONDANSETRON HCL 4 MG PO TABS: 4.0000 mg | ORAL_TABLET | Freq: Three times a day (TID) | ORAL | 0 refills | Status: AC | PRN

## 2020-11-24 MED ORDER — CHLORHEXIDINE GLUCONATE 0.12 % MT SOLN
15.0000 mL | Freq: Once | OROMUCOSAL | Status: AC
Start: 2020-11-24 — End: 2020-11-24
  Administered 2020-11-24: 15 mL via OROMUCOSAL

## 2020-11-24 MED ORDER — LACTATED RINGERS IV BOLUS
250.0000 mL | Freq: Once | INTRAVENOUS | Status: AC
  Administered 2020-11-24: 250 mL via INTRAVENOUS

## 2020-11-24 MED ORDER — CEFAZOLIN SODIUM-DEXTROSE 2-4 GM/100ML-% IV SOLN
2.0000 g | Freq: Four times a day (QID) | INTRAVENOUS | Status: AC
  Administered 2020-11-24: 2 g via INTRAVENOUS

## 2020-11-24 MED ORDER — OXYCODONE HCL 5 MG PO TABS: 5.0000 mg | ORAL_TABLET | ORAL | Status: DC | PRN

## 2020-11-24 MED ORDER — DEXAMETHASONE SODIUM PHOSPHATE 10 MG/ML IJ SOLN
INTRAMUSCULAR | Status: DC | PRN
  Administered 2020-11-24: 10 mg via INTRAVENOUS

## 2020-11-24 MED ORDER — TRANEXAMIC ACID-NACL 1000-0.7 MG/100ML-% IV SOLN
1000.0000 mg | Freq: Once | INTRAVENOUS | Status: AC
  Administered 2020-11-24: 1000 mg via INTRAVENOUS

## 2020-11-24 SURGICAL SUPPLY — 58 items
ATTUNE MED DOME PAT 32 KNEE (Knees) ×1 IMPLANT
ATTUNE PS FEM LT SZ 4 CEM KNEE (Femur) ×1 IMPLANT
BAG SPEC THK2 15X12 ZIP CLS (MISCELLANEOUS)
BAG ZIPLOCK 12X15 (MISCELLANEOUS) IMPLANT
BASEPLATE TIB CMT FB PCKT SZ5 (Knees) ×1 IMPLANT
BLADE SAG 18X100X1.27 (BLADE) ×2 IMPLANT
BLADE SAW SGTL 11.0X1.19X90.0M (BLADE) ×2 IMPLANT
BLADE SAW SGTL 13X75X1.27 (BLADE) ×2 IMPLANT
BLADE SURG 15 STRL LF DISP TIS (BLADE) ×1 IMPLANT
BLADE SURG 15 STRL SS (BLADE) ×2
BNDG CMPR MED 10X6 ELC LF (GAUZE/BANDAGES/DRESSINGS) ×1
BNDG ELASTIC 6X10 VLCR STRL LF (GAUZE/BANDAGES/DRESSINGS) ×2 IMPLANT
BOWL SMART MIX CTS (DISPOSABLE) ×2 IMPLANT
BSPLAT TIB 5 CMNT FXBRNG STRL (Knees) ×1 IMPLANT
CEMENT HV SMART SET (Cement) ×4 IMPLANT
CLSR STERI-STRIP ANTIMIC 1/2X4 (GAUZE/BANDAGES/DRESSINGS) ×4 IMPLANT
COVER SURGICAL LIGHT HANDLE (MISCELLANEOUS) ×2 IMPLANT
COVER WAND RF STERILE (DRAPES) IMPLANT
CUFF TOURN SGL QUICK 34 (TOURNIQUET CUFF) ×2
CUFF TRNQT CYL 34X4.125X (TOURNIQUET CUFF) ×1 IMPLANT
DECANTER SPIKE VIAL GLASS SM (MISCELLANEOUS) IMPLANT
DRAPE SHEET LG 3/4 BI-LAMINATE (DRAPES) ×1 IMPLANT
DRAPE U-SHAPE 47X51 STRL (DRAPES) ×2 IMPLANT
DRSG MEPILEX BORDER 4X12 (GAUZE/BANDAGES/DRESSINGS) ×2 IMPLANT
DRSG PAD ABDOMINAL 8X10 ST (GAUZE/BANDAGES/DRESSINGS) ×4 IMPLANT
DURAPREP 26ML APPLICATOR (WOUND CARE) ×4 IMPLANT
ELECT REM PT RETURN 15FT ADLT (MISCELLANEOUS) ×2 IMPLANT
GLOVE SRG 8 PF TXTR STRL LF DI (GLOVE) ×1 IMPLANT
GLOVE SURG ENC MOIS LTX SZ6.5 (GLOVE) ×2 IMPLANT
GLOVE SURG ENC MOIS LTX SZ7.5 (GLOVE) ×2 IMPLANT
GLOVE SURG UNDER POLY LF SZ7 (GLOVE) ×2 IMPLANT
GLOVE SURG UNDER POLY LF SZ8 (GLOVE) ×2
GOWN STRL REUS W/ TWL LRG LVL3 (GOWN DISPOSABLE) ×2 IMPLANT
GOWN STRL REUS W/TWL LRG LVL3 (GOWN DISPOSABLE) ×4
HANDPIECE INTERPULSE COAX TIP (DISPOSABLE) ×2
HOLDER FOLEY CATH W/STRAP (MISCELLANEOUS) ×1 IMPLANT
HOOD PEEL AWAY FLYTE STAYCOOL (MISCELLANEOUS) ×6 IMPLANT
IMMOBILIZER KNEE 20 (SOFTGOODS) ×2
IMMOBILIZER KNEE 20 THIGH 36 (SOFTGOODS) ×1 IMPLANT
INSERT TIB ATTUNE FB SZ4X8 (Insert) ×1 IMPLANT
KIT TURNOVER KIT A (KITS) ×2 IMPLANT
MANIFOLD NEPTUNE II (INSTRUMENTS) ×2 IMPLANT
NS IRRIG 1000ML POUR BTL (IV SOLUTION) ×2 IMPLANT
PACK ICE MAXI GEL EZY WRAP (MISCELLANEOUS) ×2 IMPLANT
PACK TOTAL KNEE CUSTOM (KITS) ×2 IMPLANT
PENCIL SMOKE EVACUATOR (MISCELLANEOUS) ×2 IMPLANT
PIN DRILL FIX HALF THREAD (BIT) ×1 IMPLANT
PIN STEINMAN FIXATION KNEE (PIN) ×1 IMPLANT
PROTECTOR NERVE ULNAR (MISCELLANEOUS) ×2 IMPLANT
SET HNDPC FAN SPRY TIP SCT (DISPOSABLE) ×1 IMPLANT
SET PAD KNEE POSITIONER (MISCELLANEOUS) ×2 IMPLANT
SUT VIC AB 1 CT1 36 (SUTURE) ×4 IMPLANT
SUT VIC AB 2-0 CT1 27 (SUTURE) ×4
SUT VIC AB 2-0 CT1 TAPERPNT 27 (SUTURE) ×1 IMPLANT
SUT VIC AB 3-0 SH 8-18 (SUTURE) ×2 IMPLANT
TRAY FOLEY MTR SLVR 14FR STAT (SET/KITS/TRAYS/PACK) ×1 IMPLANT
TUBE SUCTION HIGH CAP CLEAR NV (SUCTIONS) ×2 IMPLANT
WATER STERILE IRR 1000ML POUR (IV SOLUTION) ×4 IMPLANT

## 2020-11-24 NOTE — Anesthesia Procedure Notes (Signed)
Procedure Name: MAC Date/Time: 11/24/2020 7:33 AM Performed by: Lissa Morales, CRNA Pre-anesthesia Checklist: Patient identified, Patient being monitored, Emergency Drugs available and Suction available Patient Re-evaluated:Patient Re-evaluated prior to induction Oxygen Delivery Method: Simple face mask Placement Confirmation: positive ETCO2

## 2020-11-24 NOTE — Anesthesia Procedure Notes (Signed)
Spinal  End time: 11/24/2020 7:32 AM Staffing Performed: resident/CRNA  Resident/CRNA: Lissa Morales, CRNA Preanesthetic Checklist Completed: patient identified, IV checked, site marked, risks and benefits discussed, surgical consent, monitors and equipment checked, pre-op evaluation and timeout performed Spinal Block Patient position: sitting Prep: DuraPrep Patient monitoring: continuous pulse ox, blood pressure and heart rate Approach: midline Location: L3-4 Needle Needle type: Pencan  Needle gauge: 24 G Assessment Events: CSF return

## 2020-11-24 NOTE — Anesthesia Postprocedure Evaluation (Signed)
Anesthesia Post Note  Patient: Tracie Sutton  Procedure(s) Performed: TOTAL KNEE ARTHROPLASTY (Left: Knee)     Patient location during evaluation: PACU Anesthesia Type: Spinal Level of consciousness: oriented and awake and alert Pain management: pain level controlled Vital Signs Assessment: post-procedure vital signs reviewed and stable Respiratory status: spontaneous breathing, respiratory function stable and nonlabored ventilation Cardiovascular status: blood pressure returned to baseline and stable Postop Assessment: no headache, no backache, no apparent nausea or vomiting, patient able to bend at knees and spinal receding Anesthetic complications: no   No notable events documented.  Last Vitals:  Vitals:   11/24/20 1115 11/24/20 1130  BP: 125/72 121/78  Pulse: 90 85  Resp: 12 12  Temp:    SpO2: 95% 95%    Last Pain:  Vitals:   11/24/20 1100  TempSrc:   PainSc: 0-No pain                 Sumeet Geter A.

## 2020-11-24 NOTE — Op Note (Signed)
DATE OF SURGERY:  11/24/2020 TIME: 10:11 AM  PATIENT NAME:  Tracie Sutton   AGE: 70 y.o.    PRE-OPERATIVE DIAGNOSIS: Left knee primary localized osteoarthritis  POST-OPERATIVE DIAGNOSIS:  Same  PROCEDURE: LEFT total Knee Arthroplasty  SURGEON:  [name], MD   ASSISTANT:  Merlene Pulling, PA-C, present and scrubbed throughout the case, critical for assistance with exposure, retraction, instrumentation, and closure.   OPERATIVE IMPLANTS: Depuy Attune size 4 posterior Stabilized Femur, with a size 5 fixed Bearing Tibia, 8 polyethylene insert with a 32 medialized oval dome polyethylene patella.  PREOPERATIVE INDICATIONS:  Tracie Sutton is a 70 y.o. year old female with end stage bone on bone degenerative arthritis of the knee who failed conservative treatment, including injections, antiinflammatories, activity modification, and assistive devices, and had significant impairment of their activities of daily living, and elected for Total Knee Arthroplasty.   The risks, benefits, and alternatives were discussed at length including but not limited to the risks of infection, bleeding, nerve injury, stiffness, blood clots, the need for revision surgery, cardiopulmonary complications, among others, and they were willing to proceed.  OPERATIVE FINDINGS AND UNIQUE ASPECTS OF THE CASE: She had advanced arthrosis of the medial compartment as well as significant degenerative changes in the weightbearing portion of the lateral compartment.  I cut the femur twice, because she was a little bit tight in extension, and ultimately the posterior femoral cut also had a sizable amount of medial condyle removed, and so ultimately she sized to a size 8.  She was still able to reach full extension quite nicely.  I was flush on the anterior femoral cut, without significant notching.  The lateral tibia had some notching from the retractor, which I made sure was covered nicely with the plate and cement.  ESTIMATED  BLOOD LOSS: 75 mL  OPERATIVE DESCRIPTION:  The patient was brought to the operative room and placed in a supine position.  Anesthesia was administered.  IV antibiotics were given.  The lower extremity was prepped and draped in the usual sterile fashion.  Time out was performed.  The leg was elevated and exsanguinated and the tourniquet was inflated.  Anterior quadriceps tendon splitting approach was performed.  The patella was everted and osteophytes were removed.  The anterior horn of the medial and lateral meniscus was removed.   The patella was then measured, and cut with the saw.  The thickness before the cut was 21 and after the cut was 14.5.  A metal shield was used to protect the patella throughout the case.    The distal femur was opened with the drill and the intramedullary distal femoral cutting jig was utilized, set at 5 degrees resecting 9 mm off the distal femur.  Care was taken to protect the collateral ligaments.  Then the extramedullary tibial cutting jig was utilized making the appropriate cut using the anterior tibial crest as a reference building in appropriate posterior slope.  I moved it slightly medial approximately 2 clicks.  I was taking more off of the lateral side, skimming the medial side.  I used a 2 mm reference off of the eburnated portion of the medial side.  Care was taken during the cut to protect the medial and collateral ligaments.  The proximal tibia was removed along with the posterior horns of the menisci.  The PCL was sacrificed.    The extensor gap was measured and found to have adequate resection, measuring to a size 5, although initially it  was too tight and I went back and took 2 more off of the femur.    The distal femoral sizing jig was applied, taking care to avoid notching.  This was set at 3 degrees of external rotation.  Then the 4-in-1 cutting jig was applied and the anterior and posterior femur was cut, along with the chamfer cuts.  All posterior  osteophytes were removed.  The flexion gap was then measured and was symmetric with the extension gap.  I completed the distal femoral preparation using the appropriate jig to prepare the box.  The proximal tibia sized and prepared accordingly with the reamer and the punch, and then all components were trialed with the poly insert.  The knee was found to have excellent balance and full motion.    The above named components were then cemented into place and all excess cement was removed.  The real polyethylene implant was placed.  After the cement had cured I released the tourniquet and confirmed excellent hemostasis with no major posterior vessel injury.    The knee was easily taken through a range of motion and the patella tracked well and the knee irrigated copiously and the parapatellar and subcutaneous tissue closed with vicryl, and monocryl with steri strips for the skin.  The wounds were injected with marcaine, and dressed with sterile gauze and the patient was awakened and returned to the PACU in stable and satisfactory condition.  There were no complications.  Total tourniquet time was 103 minutes.

## 2020-11-24 NOTE — Anesthesia Procedure Notes (Signed)
Anesthesia Regional Block: Adductor canal block   Pre-Anesthetic Checklist: , timeout performed,  Correct Patient, Correct Site, Correct Laterality,  Correct Procedure, Correct Position, site marked,  Risks and benefits discussed,  Surgical consent,  Pre-op evaluation,  At surgeon's request and post-op pain management  Laterality: Left  Prep: chloraprep       Needles:  Injection technique: Single-shot  Needle Type: Echogenic Stimulator Needle     Needle Length: 10cm  Needle Gauge: 21   Needle insertion depth: 7 cm   Additional Needles:   Procedures:,,,, ultrasound used (permanent image in chart),,    Narrative:  Start time: 11/24/2020 7:06 AM End time: 11/24/2020 7:11 AM Injection made incrementally with aspirations every 5 mL.  Performed by: Personally  Anesthesiologist: Josephine Igo, MD  Additional Notes: Timeout performed. Patient sedated. Relevant anatomy ID'd using [place]. Incremental 2-46ml injection of LA with frequent aspiration. Patient tolerated procedure well.    Left Adductor Canal Block

## 2020-11-24 NOTE — Discharge Instructions (Signed)

## 2020-11-24 NOTE — Interval H&P Note (Signed)
History and Physical Interval Note:  11/24/2020 7:21 AM  Tracie Sutton  has presented today for surgery, with the diagnosis of djd left knee.  The various methods of treatment have been discussed with the patient and family. After consideration of risks, benefits and other options for treatment, the patient has consented to  Procedure(s): TOTAL KNEE ARTHROPLASTY (Left) as a surgical intervention.  The patient's history has been reviewed, patient examined, no change in status, stable for surgery.  I have reviewed the patient's chart and labs.  Questions were answered to the patient's satisfaction.     [name]

## 2020-11-24 NOTE — Transfer of Care (Signed)
Immediate Anesthesia Transfer of Care Note  Patient: Tracie Sutton  Procedure(s) Performed: TOTAL KNEE ARTHROPLASTY (Left: Knee)  Patient Location: PACU  Anesthesia Type:Spinal  Level of Consciousness: awake, alert , oriented and patient cooperative  Airway & Oxygen Therapy: Patient Spontanous Breathing and Patient connected to face mask oxygen  Post-op Assessment: Report given to RN, Post -op Vital signs reviewed and stable and Patient moving all extremities X 4  Post vital signs: stable  Last Vitals:  Vitals Value Taken Time  BP 129/77 11/24/20 1045  Temp    Pulse 100 11/24/20 1048  Resp 12 11/24/20 1048  SpO2 100 % 11/24/20 1048  Vitals shown include unvalidated device data.  Last Pain:  Vitals:   11/24/20 0621  TempSrc:   PainSc: 0-No pain         Complications: No notable events documented.

## 2020-11-24 NOTE — Evaluation (Signed)
Physical Therapy Evaluation Patient Details Name: Tracie Sutton MRN: [id number] DOB: 11-08-1950 Today's Date: 11/24/2020   History of Present Illness  70 y.o. female admitted 11/24/2020 for L TKA. PMH includes HTN, mitral valve prolapse.  Clinical Impression  Pt ambulated 110' with RW, completed stair training, and demonstrates good understanding of HEP. She is ready to DC home from Pt standpoint.     Follow Up Recommendations Follow surgeon's recommendation for DC plan and follow-up therapies;Home health PT    Equipment Recommendations  Rolling walker with 5" wheels;3in1 (PT)    Recommendations for Other Services       Precautions / Restrictions Precautions Precautions: Knee Precaution Booklet Issued: Yes (comment) Precaution Comments: reviewed no pillow under knee Required Braces or Orthoses: Knee Immobilizer - Left Knee Immobilizer - Left: On when out of bed or walking Restrictions Weight Bearing Restrictions: No Other Position/Activity Restrictions: WBAT      Mobility  Bed Mobility Overal bed mobility: Modified Independent             General bed mobility comments: HOB up    Transfers Overall transfer level: Needs assistance Equipment used: Rolling walker (2 wheeled) Transfers: Sit to/from Stand Sit to Stand: Supervision;Min guard         General transfer comment: VCs hand placement  Ambulation/Gait Ambulation/Gait assistance: Supervision Gait Distance (Feet): 110 Feet Assistive device: Rolling walker (2 wheeled) Gait Pattern/deviations: Step-to pattern;Decreased step length - right;Decreased step length - left Gait velocity: decr   General Gait Details: steady with RW, buckled without KI despite being able to do SLR so applied L KI, no loss of balance  Stairs Stairs: Yes Stairs assistance: Min assist Stair Management: No rails;Backwards;Step to pattern Number of Stairs: 2 General stair comments: VCs sequencing, min A RW  Wheelchair Mobility     Modified Rankin (Stroke Patients Only)       Balance Overall balance assessment: Modified Independent                                           Pertinent Vitals/Pain Pain Assessment: 0-10 Pain Score: 1  Pain Location: L knee Pain Descriptors / Indicators: Sore Pain Intervention(s): Limited activity within patient's tolerance;Monitored during session;Ice applied    Home Living Family/patient expects to be discharged to:: Private residence Living Arrangements: Alone Available Help at Discharge: Family;Available 24 hours/day Type of Home: House Home Access: Stairs to enter Entrance Stairs-Rails: None Entrance Stairs-Number of Steps: 2 Home Layout: Two level;Able to live on main level with bedroom/bathroom Home Equipment: Kasandra Knudsen - single point Additional Comments: cousin to stay with pt first 3 days    Prior Function Level of Independence: Independent               Hand Dominance        Extremity/Trunk Assessment   Upper Extremity Assessment Upper Extremity Assessment: Overall WFL for tasks assessed    Lower Extremity Assessment Lower Extremity Assessment: LLE deficits/detail LLE Deficits / Details: 3/5 SLR, -3/5 knee ext, knee AAROM 5-50* LLE Sensation: WNL LLE Coordination: WNL    Cervical / Trunk Assessment Cervical / Trunk Assessment: Normal  Communication   Communication: No difficulties  Cognition Arousal/Alertness: Awake/alert Behavior During Therapy: WFL for tasks assessed/performed Overall Cognitive Status: Within Functional Limits for tasks assessed  General Comments      Exercises Total Joint Exercises Ankle Circles/Pumps: AROM;Both;10 reps;Supine Quad Sets: AROM;Both;5 reps;Supine Short Arc Quad: AROM;Left;5 reps;Supine Heel Slides: AAROM;Left;10 reps;Supine Hip ABduction/ADduction: AAROM;Left;5 reps;Supine Straight Leg Raises: AROM;Left;5 reps;Supine Long Arc  Quad: AROM;Left;5 reps;Seated Goniometric ROM: 5-50*AAROM L knee   Assessment/Plan    PT Assessment All further PT needs can be met in the next venue of care  PT Problem List Decreased range of motion;Decreased mobility;Decreased strength;Decreased activity tolerance;Decreased knowledge of use of DME;Pain       PT Treatment Interventions      PT Goals (Current goals can be found in the Care Plan section)  Acute Rehab PT Goals Patient Stated Goal: return to work at [employer], ride her bike PT Goal Formulation: All assessment and education complete, DC therapy    Frequency     Barriers to discharge        Co-evaluation               AM-PAC PT "6 Clicks" Mobility  Outcome Measure Help needed turning from your back to your side while in a flat bed without using bedrails?: None Help needed moving from lying on your back to sitting on the side of a flat bed without using bedrails?: A Little Help needed moving to and from a bed to a chair (including a wheelchair)?: A Little Help needed standing up from a chair using your arms (e.g., wheelchair or bedside chair)?: None Help needed to walk in hospital room?: None Help needed climbing 3-5 steps with a railing? : A Little 6 Click Score: 21    End of Session Equipment Utilized During Treatment: Gait belt Activity Tolerance: Patient tolerated treatment well Patient left: in bed;with call bell/phone within reach Nurse Communication: Mobility status PT Visit Diagnosis: Difficulty in walking, not elsewhere classified (R26.2);Pain;Muscle weakness (generalized) (M62.81) Pain - Right/Left: Left Pain - part of body: Knee    Time: 8832-5498 PT Time Calculation (min) (ACUTE ONLY): 74 min   Charges:   PT Evaluation $PT Eval Moderate Complexity: 1 Mod PT Treatments $Gait Training: 23-37 mins $Therapeutic Exercise: 8-22 mins $Therapeutic Activity: 8-22 mins        Tracie Sutton PT 11/24/2020  Acute  Rehabilitation Services Pager [phone] Office [phone]

## 2020-11-25 ENCOUNTER — Encounter (HOSPITAL_COMMUNITY): Payer: Self-pay | Admitting: Orthopedic Surgery

## 2021-06-02 IMAGING — CR DG SHOULDER 2+V*L*
3 series · 3 of 3 positions shown · non-contrast
Comparison: None.

CLINICAL DATA: Left neck and shoulder pain

EXAM:
LEFT SHOULDER - 2+ VIEW

[w shoulder grashey left]
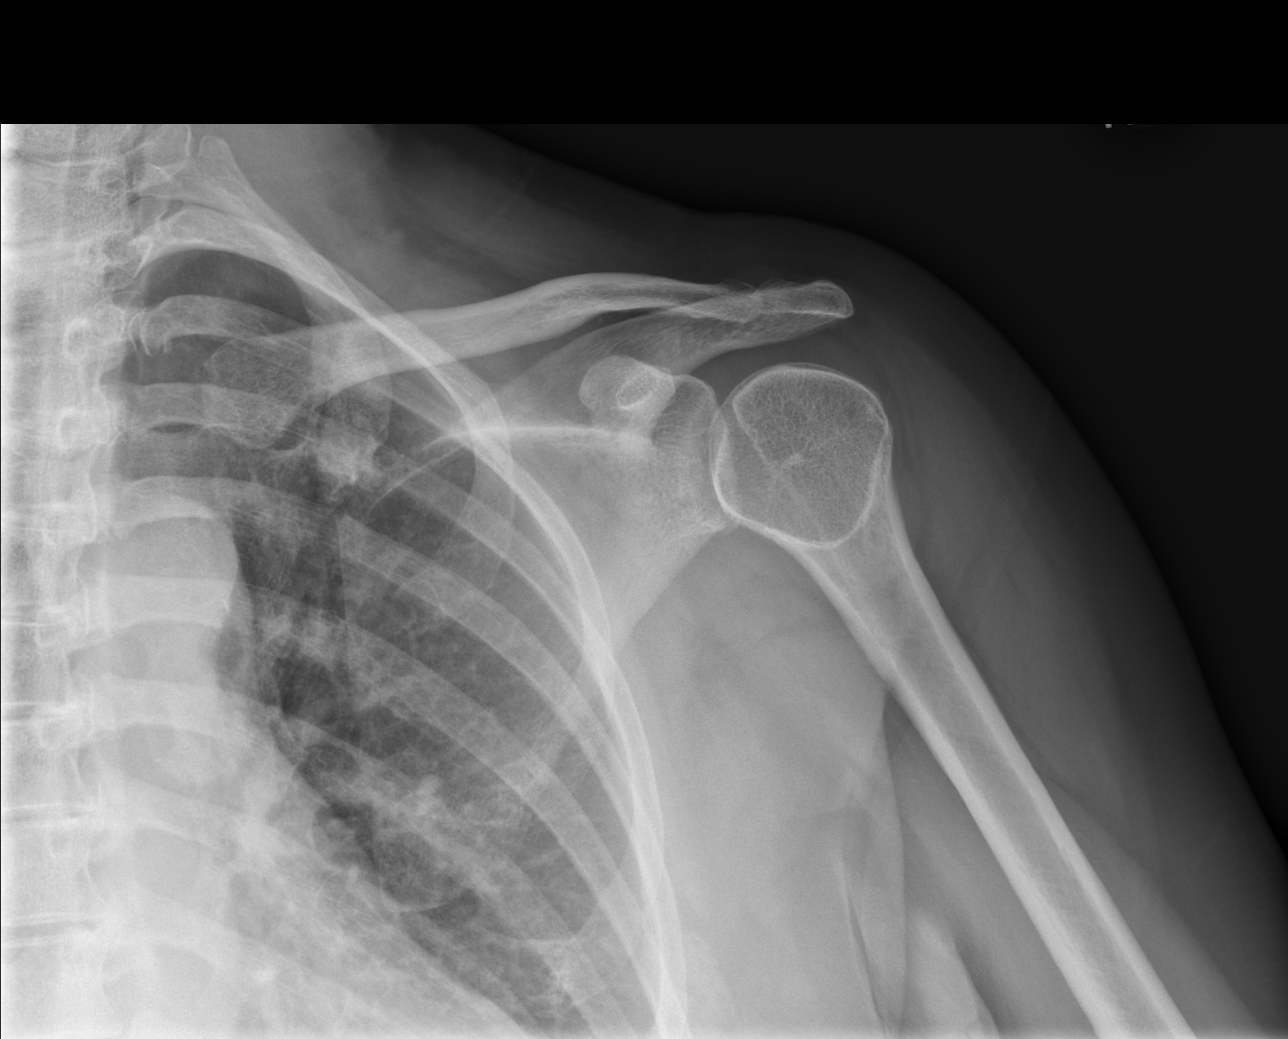

[w shoulder y-view left]
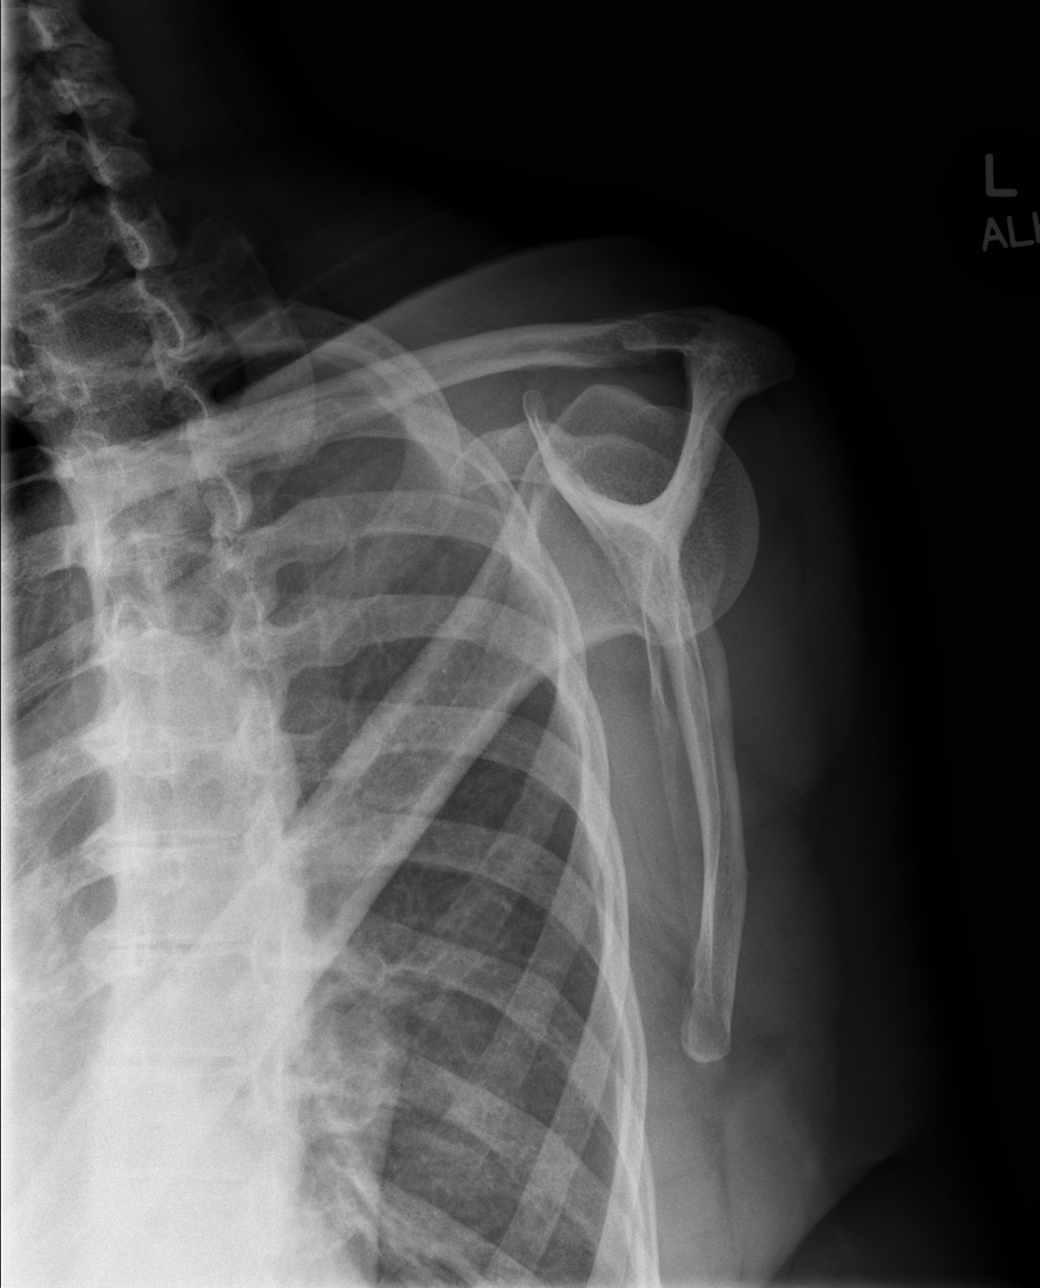

[x shoulder axillary left]
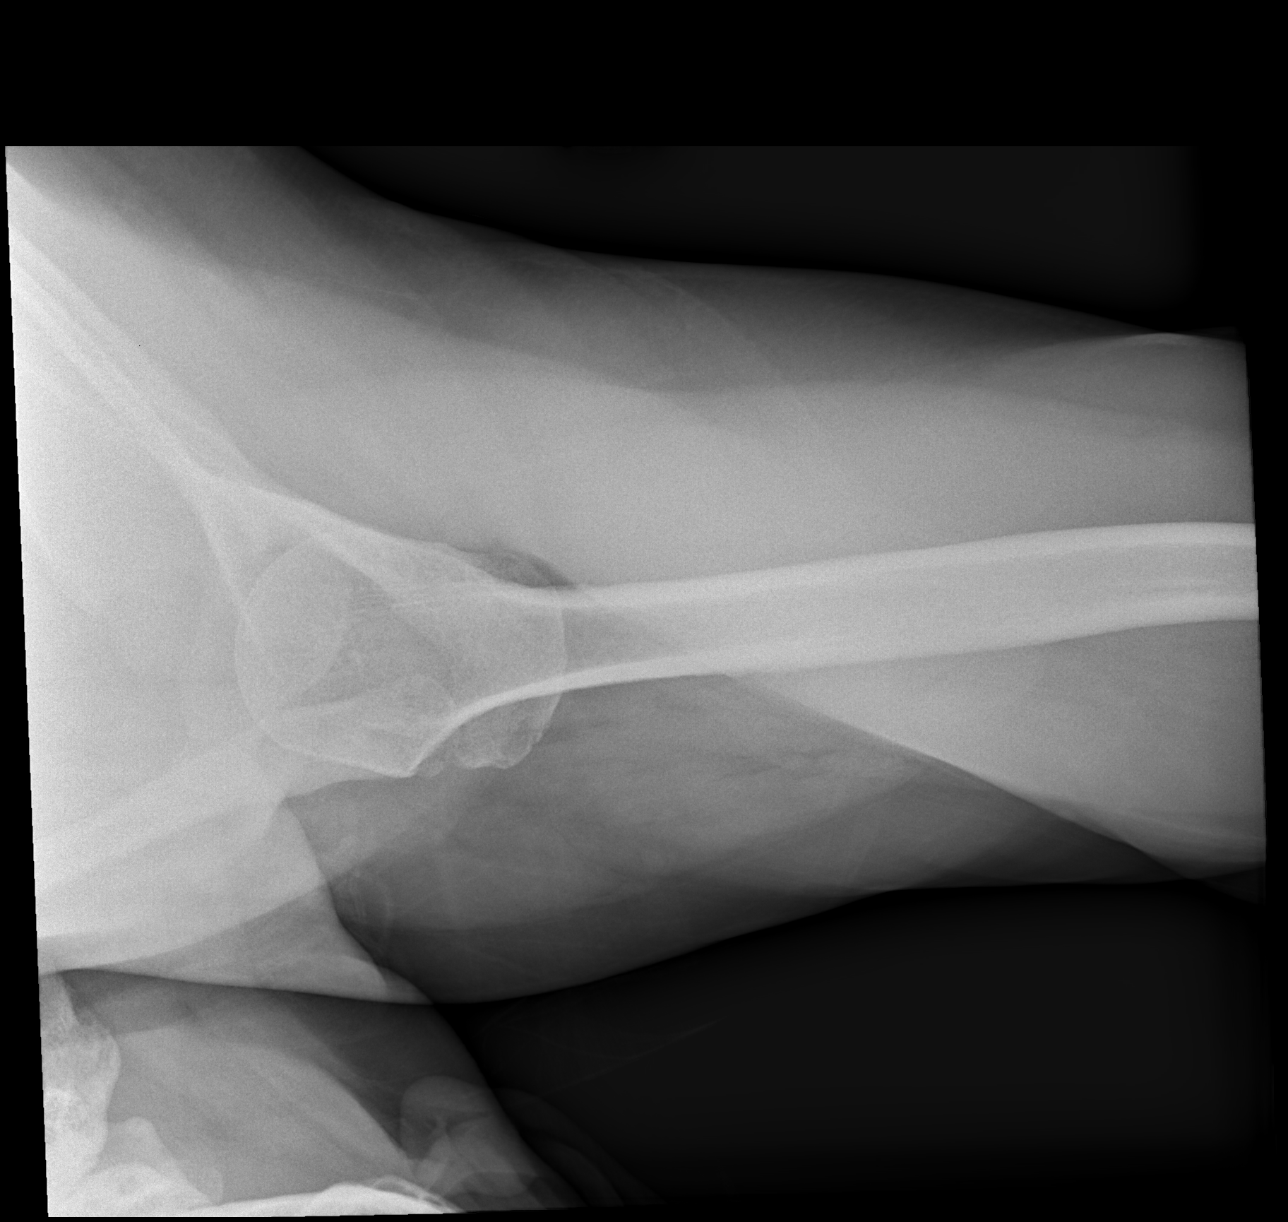

[3 of 3 positions shown; findings below may reference images not displayed]

FINDINGS: There is no evidence of fracture or dislocation. There is no
evidence of arthropathy or other focal bone abnormality. Soft
tissues are unremarkable.
IMPRESSION: Negative.

## 2021-06-02 IMAGING — CR DG CERVICAL SPINE COMPLETE 4+V
6 series · 6 of 6 positions shown · non-contrast
Comparison: None.

CLINICAL DATA: Neck and left shoulder pain

EXAM:
CERVICAL SPINE - COMPLETE 4+ VIEW

[w cervical spine lat]
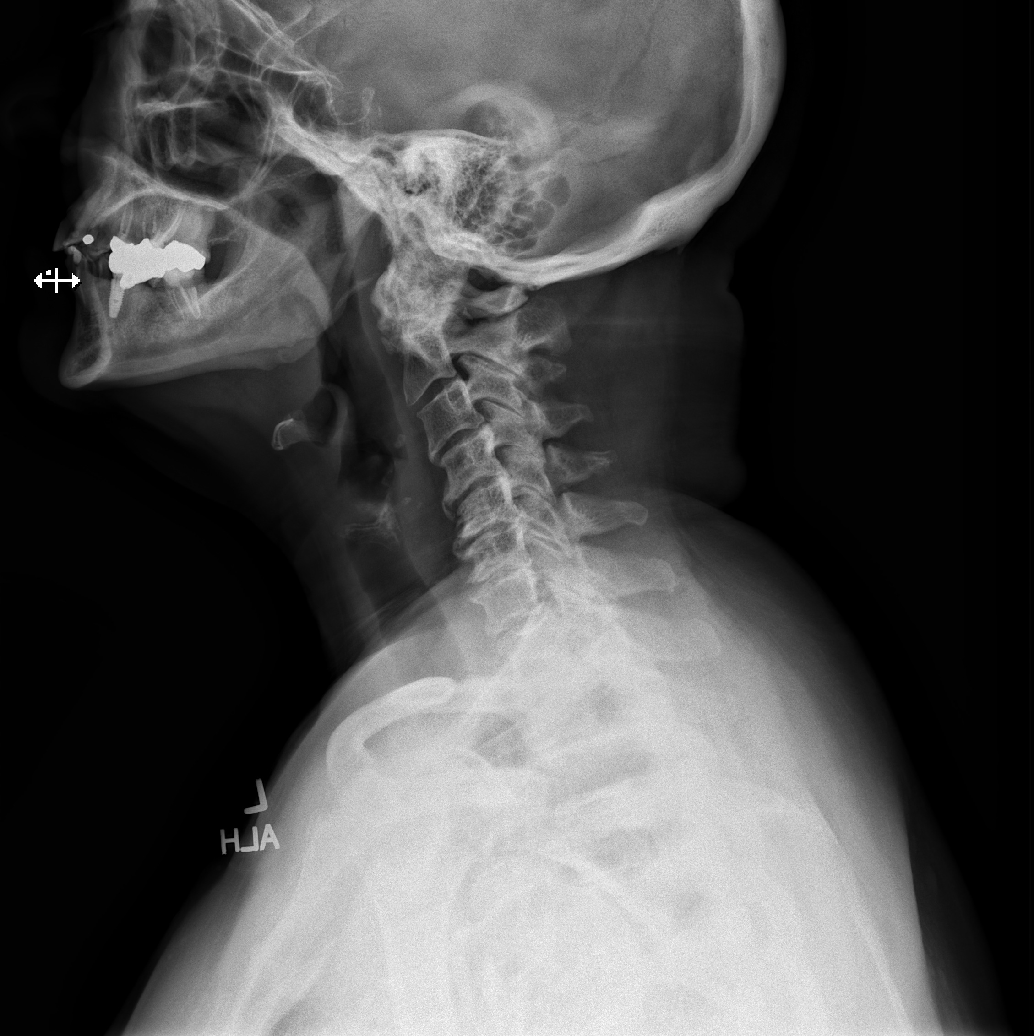

[w cervical spine ap_obl (1 of 2)]
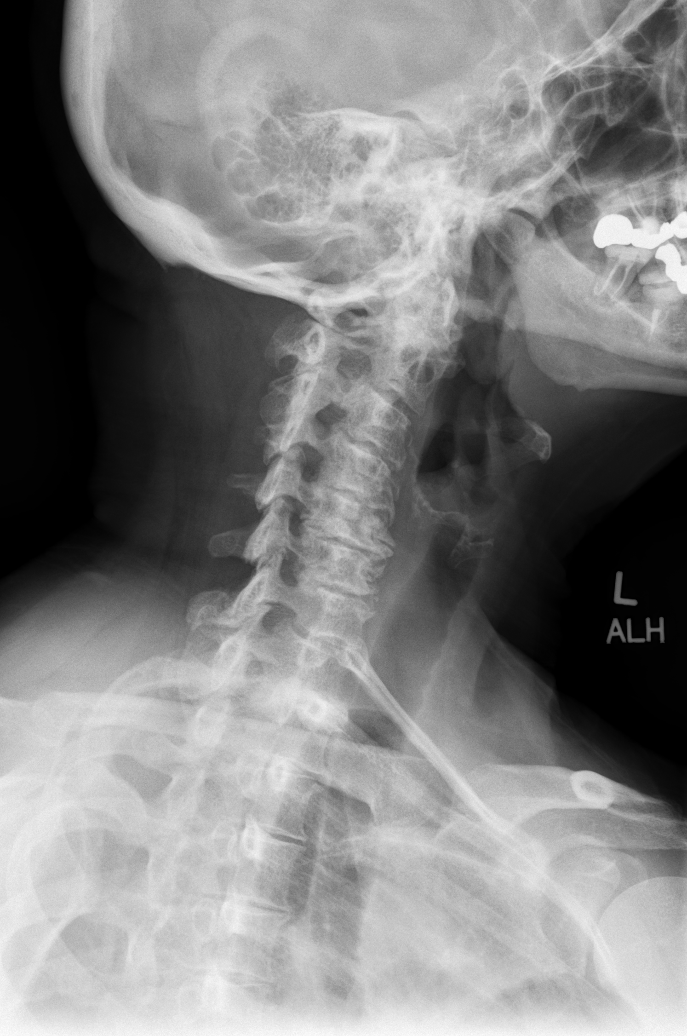

[w cervical spine ap_obl (2 of 2)]
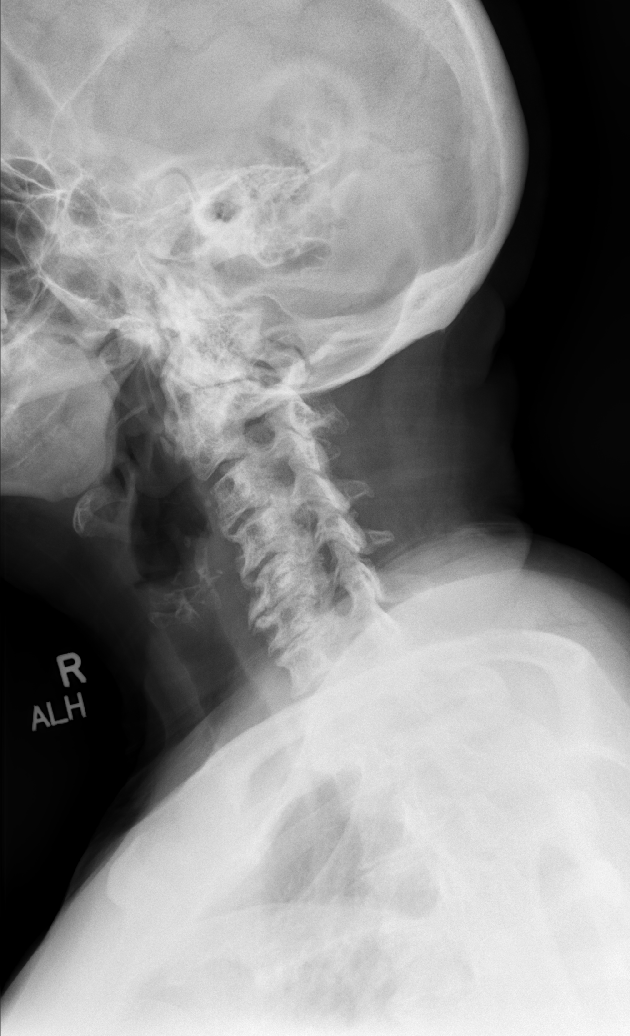

[w cervical spine ap]
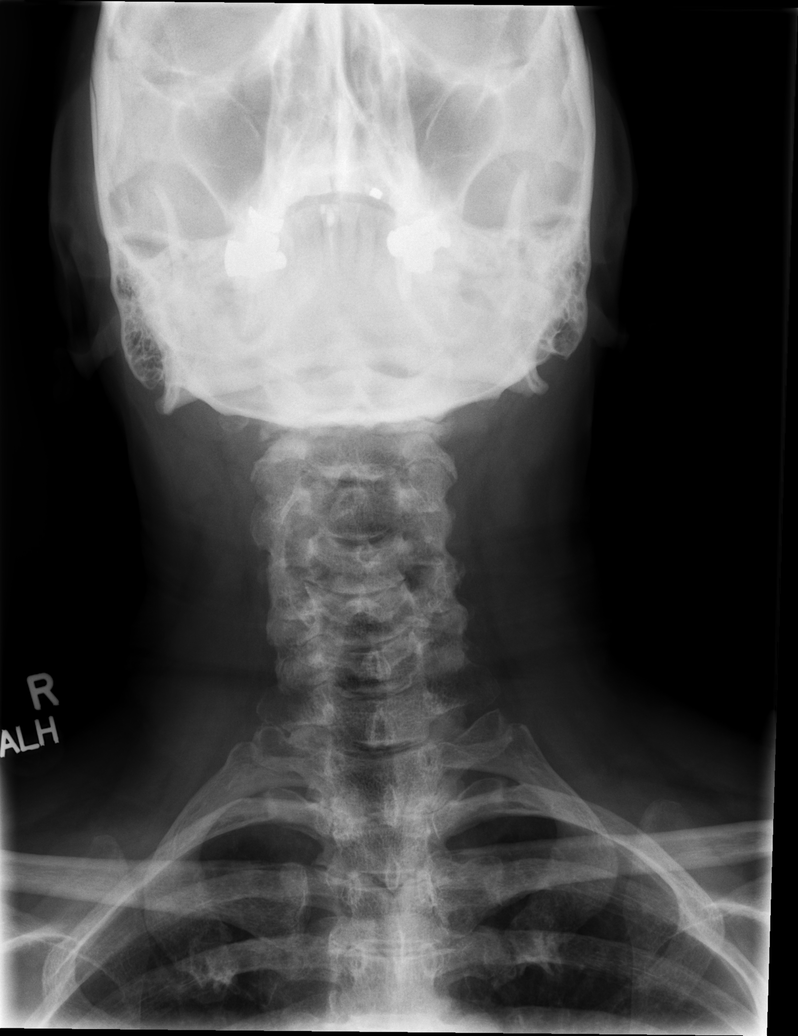

[w cervical spine odontoid (1 of 2)]
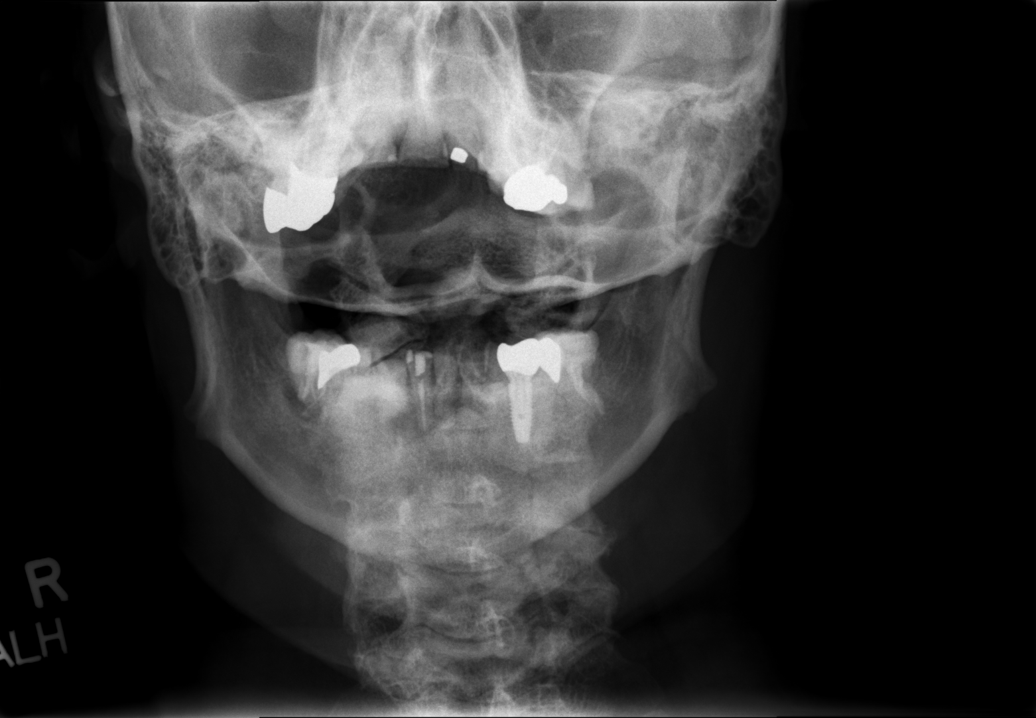

[w cervical spine odontoid (2 of 2)]
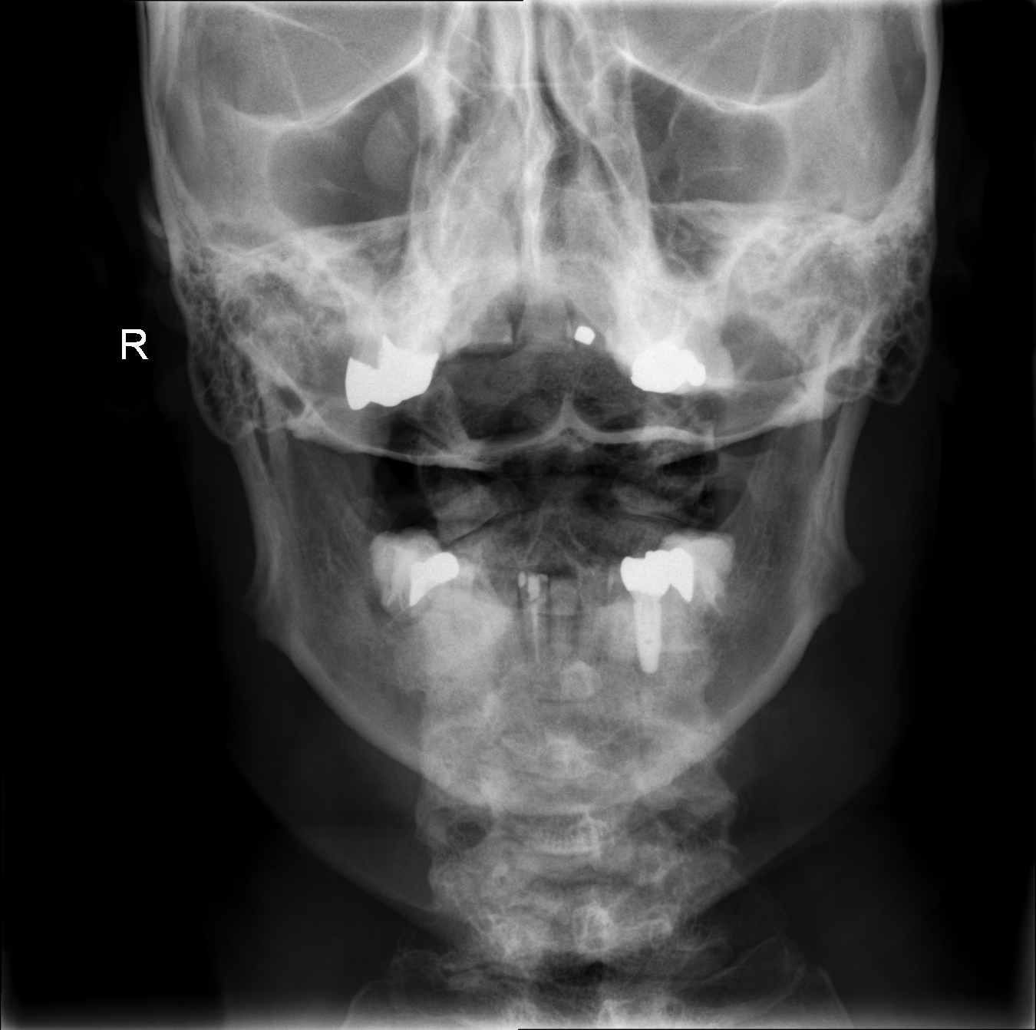

[6 of 6 positions shown; findings below may reference images not displayed]

FINDINGS: Diffuse degenerative disc and facet disease. Right neural foraminal
narrowing at C5-6 and C6-7 and left neural foraminal narrowing at
C4-5 and C5-6 due to uncovertebral spurring and facet disease.
Normal alignment. No fracture. Prevertebral soft tissues are normal.
IMPRESSION: Degenerative disc and facet disease as above. No acute bony
abnormality.

## 2022-05-31 ENCOUNTER — Ambulatory Visit
Admission: RE | Admit: 2022-05-31 | Discharge: 2022-05-31 | Disposition: A | Discharge status: Home / Self Care | Payer: Medicare PPO | Source: Ambulatory Visit | Attending: Internal Medicine | Admitting: Internal Medicine

## 2022-05-31 ENCOUNTER — Other Ambulatory Visit: Payer: Self-pay | Admitting: Internal Medicine

## 2022-05-31 DIAGNOSIS — R220 Localized swelling, mass and lump, head: Secondary | ICD-10-CM

## 2023-02-15 ENCOUNTER — Other Ambulatory Visit: Payer: Self-pay | Admitting: Internal Medicine

## 2023-02-15 ENCOUNTER — Ambulatory Visit
Admission: RE | Admit: 2023-02-15 | Discharge: 2023-02-15 | Disposition: A | Discharge status: Home / Self Care | Payer: Medicare PPO | Source: Ambulatory Visit | Attending: Internal Medicine | Admitting: Internal Medicine

## 2023-02-15 DIAGNOSIS — R059 Cough, unspecified: Secondary | ICD-10-CM

## 2023-02-28 ENCOUNTER — Ambulatory Visit
Admission: RE | Admit: 2023-02-28 | Discharge: 2023-02-28 | Disposition: A | Discharge status: Home / Self Care | Payer: Medicare PPO | Source: Ambulatory Visit | Attending: Internal Medicine | Admitting: Internal Medicine

## 2023-02-28 ENCOUNTER — Other Ambulatory Visit: Payer: Self-pay | Admitting: Internal Medicine

## 2023-02-28 DIAGNOSIS — M545 Low back pain, unspecified: Secondary | ICD-10-CM

## 2023-06-12 ENCOUNTER — Ambulatory Visit
Admission: RE | Admit: 2023-06-12 | Discharge: 2023-06-12 | Disposition: A | Discharge status: Home / Self Care | Payer: Medicare PPO | Source: Ambulatory Visit | Attending: Internal Medicine | Admitting: Internal Medicine

## 2023-06-12 ENCOUNTER — Other Ambulatory Visit: Payer: Self-pay | Admitting: Internal Medicine

## 2023-06-12 DIAGNOSIS — R220 Localized swelling, mass and lump, head: Secondary | ICD-10-CM

## 2023-06-16 DIAGNOSIS — E782 Mixed hyperlipidemia: Secondary | ICD-10-CM | POA: Diagnosis not present

## 2023-06-16 DIAGNOSIS — E790 Hyperuricemia without signs of inflammatory arthritis and tophaceous disease: Secondary | ICD-10-CM | POA: Diagnosis not present

## 2023-06-16 DIAGNOSIS — I119 Hypertensive heart disease without heart failure: Secondary | ICD-10-CM | POA: Diagnosis not present

## 2023-06-16 DIAGNOSIS — R7303 Prediabetes: Secondary | ICD-10-CM | POA: Diagnosis not present

## 2023-08-23 DIAGNOSIS — M542 Cervicalgia: Secondary | ICD-10-CM | POA: Diagnosis not present

## 2023-08-30 DIAGNOSIS — Z01419 Encounter for gynecological examination (general) (routine) without abnormal findings: Secondary | ICD-10-CM | POA: Diagnosis not present

## 2023-08-30 DIAGNOSIS — Z1231 Encounter for screening mammogram for malignant neoplasm of breast: Secondary | ICD-10-CM | POA: Diagnosis not present

## 2023-08-30 DIAGNOSIS — Z1239 Encounter for other screening for malignant neoplasm of breast: Secondary | ICD-10-CM | POA: Diagnosis not present

## 2023-08-30 DIAGNOSIS — Z13820 Encounter for screening for osteoporosis: Secondary | ICD-10-CM | POA: Diagnosis not present

## 2023-08-30 DIAGNOSIS — Z90711 Acquired absence of uterus with remaining cervical stump: Secondary | ICD-10-CM | POA: Diagnosis not present

## 2023-09-05 DIAGNOSIS — H43812 Vitreous degeneration, left eye: Secondary | ICD-10-CM | POA: Diagnosis not present

## 2023-09-05 DIAGNOSIS — H401131 Primary open-angle glaucoma, bilateral, mild stage: Secondary | ICD-10-CM | POA: Diagnosis not present

## 2023-09-06 DIAGNOSIS — E2839 Other primary ovarian failure: Secondary | ICD-10-CM | POA: Diagnosis not present

## 2023-09-06 DIAGNOSIS — N958 Other specified menopausal and perimenopausal disorders: Secondary | ICD-10-CM | POA: Diagnosis not present

## 2023-09-06 DIAGNOSIS — Z8262 Family history of osteoporosis: Secondary | ICD-10-CM | POA: Diagnosis not present

## 2023-09-06 DIAGNOSIS — R29890 Loss of height: Secondary | ICD-10-CM | POA: Diagnosis not present

## 2023-09-20 DIAGNOSIS — M542 Cervicalgia: Secondary | ICD-10-CM | POA: Diagnosis not present

## 2023-10-02 DIAGNOSIS — R7303 Prediabetes: Secondary | ICD-10-CM | POA: Diagnosis not present

## 2023-10-02 DIAGNOSIS — E039 Hypothyroidism, unspecified: Secondary | ICD-10-CM | POA: Diagnosis not present

## 2023-10-02 DIAGNOSIS — N1831 Chronic kidney disease, stage 3a: Secondary | ICD-10-CM | POA: Diagnosis not present

## 2023-10-02 DIAGNOSIS — R0683 Snoring: Secondary | ICD-10-CM | POA: Diagnosis not present

## 2023-10-02 DIAGNOSIS — Z6830 Body mass index (BMI) 30.0-30.9, adult: Secondary | ICD-10-CM | POA: Diagnosis not present

## 2023-10-02 DIAGNOSIS — Z713 Dietary counseling and surveillance: Secondary | ICD-10-CM | POA: Diagnosis not present

## 2023-10-02 DIAGNOSIS — J4520 Mild intermittent asthma, uncomplicated: Secondary | ICD-10-CM | POA: Diagnosis not present

## 2023-10-02 DIAGNOSIS — K219 Gastro-esophageal reflux disease without esophagitis: Secondary | ICD-10-CM | POA: Diagnosis not present

## 2023-10-02 DIAGNOSIS — I119 Hypertensive heart disease without heart failure: Secondary | ICD-10-CM | POA: Diagnosis not present

## 2023-10-02 DIAGNOSIS — E782 Mixed hyperlipidemia: Secondary | ICD-10-CM | POA: Diagnosis not present

## 2023-10-05 DIAGNOSIS — J4521 Mild intermittent asthma with (acute) exacerbation: Secondary | ICD-10-CM | POA: Diagnosis not present

## 2023-10-05 DIAGNOSIS — J209 Acute bronchitis, unspecified: Secondary | ICD-10-CM | POA: Diagnosis not present

## 2023-10-05 DIAGNOSIS — I10 Essential (primary) hypertension: Secondary | ICD-10-CM | POA: Diagnosis not present

## 2023-10-05 DIAGNOSIS — R7303 Prediabetes: Secondary | ICD-10-CM | POA: Diagnosis not present

## 2023-10-18 DIAGNOSIS — M542 Cervicalgia: Secondary | ICD-10-CM | POA: Diagnosis not present

## 2023-11-10 DIAGNOSIS — R7303 Prediabetes: Secondary | ICD-10-CM | POA: Diagnosis not present

## 2023-11-10 DIAGNOSIS — Z6830 Body mass index (BMI) 30.0-30.9, adult: Secondary | ICD-10-CM | POA: Diagnosis not present

## 2023-11-10 DIAGNOSIS — K219 Gastro-esophageal reflux disease without esophagitis: Secondary | ICD-10-CM | POA: Diagnosis not present

## 2023-11-10 DIAGNOSIS — I119 Hypertensive heart disease without heart failure: Secondary | ICD-10-CM | POA: Diagnosis not present

## 2023-11-10 DIAGNOSIS — N1831 Chronic kidney disease, stage 3a: Secondary | ICD-10-CM | POA: Diagnosis not present

## 2023-11-10 DIAGNOSIS — J4520 Mild intermittent asthma, uncomplicated: Secondary | ICD-10-CM | POA: Diagnosis not present

## 2023-11-10 DIAGNOSIS — E039 Hypothyroidism, unspecified: Secondary | ICD-10-CM | POA: Diagnosis not present

## 2023-11-10 DIAGNOSIS — Z713 Dietary counseling and surveillance: Secondary | ICD-10-CM | POA: Diagnosis not present

## 2023-11-10 DIAGNOSIS — E782 Mixed hyperlipidemia: Secondary | ICD-10-CM | POA: Diagnosis not present

## 2024-01-19 DIAGNOSIS — E559 Vitamin D deficiency, unspecified: Secondary | ICD-10-CM | POA: Diagnosis not present

## 2024-01-19 DIAGNOSIS — R7303 Prediabetes: Secondary | ICD-10-CM | POA: Diagnosis not present

## 2024-01-19 DIAGNOSIS — E039 Hypothyroidism, unspecified: Secondary | ICD-10-CM | POA: Diagnosis not present

## 2024-01-19 DIAGNOSIS — J4520 Mild intermittent asthma, uncomplicated: Secondary | ICD-10-CM | POA: Diagnosis not present

## 2024-01-19 DIAGNOSIS — L648 Other androgenic alopecia: Secondary | ICD-10-CM | POA: Diagnosis not present

## 2024-01-19 DIAGNOSIS — E782 Mixed hyperlipidemia: Secondary | ICD-10-CM | POA: Diagnosis not present

## 2024-01-19 DIAGNOSIS — K219 Gastro-esophageal reflux disease without esophagitis: Secondary | ICD-10-CM | POA: Diagnosis not present

## 2024-01-19 DIAGNOSIS — Z713 Dietary counseling and surveillance: Secondary | ICD-10-CM | POA: Diagnosis not present

## 2024-01-19 DIAGNOSIS — I119 Hypertensive heart disease without heart failure: Secondary | ICD-10-CM | POA: Diagnosis not present

## 2024-01-19 DIAGNOSIS — N1831 Chronic kidney disease, stage 3a: Secondary | ICD-10-CM | POA: Diagnosis not present

## 2024-01-19 DIAGNOSIS — R238 Other skin changes: Secondary | ICD-10-CM | POA: Diagnosis not present

## 2024-01-19 DIAGNOSIS — Z6830 Body mass index (BMI) 30.0-30.9, adult: Secondary | ICD-10-CM | POA: Diagnosis not present

## 2024-02-16 DIAGNOSIS — M1811 Unilateral primary osteoarthritis of first carpometacarpal joint, right hand: Secondary | ICD-10-CM | POA: Diagnosis not present

## 2024-02-16 DIAGNOSIS — M542 Cervicalgia: Secondary | ICD-10-CM | POA: Diagnosis not present

## 2024-02-23 DIAGNOSIS — E66812 Obesity, class 2: Secondary | ICD-10-CM | POA: Diagnosis not present

## 2024-02-23 DIAGNOSIS — N1831 Chronic kidney disease, stage 3a: Secondary | ICD-10-CM | POA: Diagnosis not present

## 2024-02-23 DIAGNOSIS — R7303 Prediabetes: Secondary | ICD-10-CM | POA: Diagnosis not present

## 2024-02-23 DIAGNOSIS — E782 Mixed hyperlipidemia: Secondary | ICD-10-CM | POA: Diagnosis not present

## 2024-02-23 DIAGNOSIS — L648 Other androgenic alopecia: Secondary | ICD-10-CM | POA: Diagnosis not present

## 2024-02-23 DIAGNOSIS — K219 Gastro-esophageal reflux disease without esophagitis: Secondary | ICD-10-CM | POA: Diagnosis not present

## 2024-02-23 DIAGNOSIS — E039 Hypothyroidism, unspecified: Secondary | ICD-10-CM | POA: Diagnosis not present

## 2024-02-23 DIAGNOSIS — I119 Hypertensive heart disease without heart failure: Secondary | ICD-10-CM | POA: Diagnosis not present

## 2024-02-23 DIAGNOSIS — J4520 Mild intermittent asthma, uncomplicated: Secondary | ICD-10-CM | POA: Diagnosis not present

## 2024-03-06 DIAGNOSIS — Z23 Encounter for immunization: Secondary | ICD-10-CM | POA: Diagnosis not present

## 2024-03-12 DIAGNOSIS — Z961 Presence of intraocular lens: Secondary | ICD-10-CM | POA: Diagnosis not present

## 2024-03-12 DIAGNOSIS — H401131 Primary open-angle glaucoma, bilateral, mild stage: Secondary | ICD-10-CM | POA: Diagnosis not present

## 2024-03-19 ENCOUNTER — Encounter: Payer: Self-pay | Admitting: *Deleted

## 2024-03-19 NOTE — Progress Notes (Signed)
 Tracie Sutton                                          MRN: [id number]   03/19/2024   The [facility] Quality Team Specialist reviewed this patient medical record for the purposes of chart review for care gap closure. The following were reviewed: chart review for care gap closure-controlling blood pressure.    [facility] Quality Team

## 2024-04-29 DIAGNOSIS — H16143 Punctate keratitis, bilateral: Secondary | ICD-10-CM | POA: Diagnosis not present

## 2024-04-29 DIAGNOSIS — H04123 Dry eye syndrome of bilateral lacrimal glands: Secondary | ICD-10-CM | POA: Diagnosis not present

## 2024-04-29 DIAGNOSIS — Z961 Presence of intraocular lens: Secondary | ICD-10-CM | POA: Diagnosis not present

## 2024-06-27 ENCOUNTER — Other Ambulatory Visit: Payer: Self-pay | Admitting: Internal Medicine

## 2024-06-27 DIAGNOSIS — M5412 Radiculopathy, cervical region: Secondary | ICD-10-CM

## 2024-06-27 DIAGNOSIS — M4322 Fusion of spine, cervical region: Secondary | ICD-10-CM

## 2024-07-06 ENCOUNTER — Ambulatory Visit
Admission: RE | Admit: 2024-07-06 | Discharge: 2024-07-06 | Disposition: A | Source: Ambulatory Visit | Attending: Internal Medicine | Admitting: Internal Medicine

## 2024-07-06 DIAGNOSIS — M4322 Fusion of spine, cervical region: Secondary | ICD-10-CM

## 2024-07-06 DIAGNOSIS — M5412 Radiculopathy, cervical region: Secondary | ICD-10-CM

## 2024-07-06 MED ORDER — GADOPICLENOL 0.5 MMOL/ML IV SOLN
9.0000 mL | Freq: Once | INTRAVENOUS | Status: AC | PRN
  Administered 2024-07-06: 9 mL via INTRAVENOUS
